# Patient Record
Sex: Male | Born: 1965 | Race: White | Hispanic: No | Smoking: Never smoker
Health system: Southern US, Community
[De-identification: ages and names within clinical notes are randomized; demographics above are authoritative.]

## PROBLEM LIST (undated history)

## (undated) DIAGNOSIS — E119 Type 2 diabetes mellitus without complications: Secondary | ICD-10-CM

## (undated) DIAGNOSIS — H9190 Unspecified hearing loss, unspecified ear: Secondary | ICD-10-CM

## (undated) DIAGNOSIS — E785 Hyperlipidemia, unspecified: Secondary | ICD-10-CM

## (undated) HISTORY — PX: BUNIONECTOMY: SHX129

## (undated) HISTORY — DX: Hyperlipidemia, unspecified: E78.5

---

## 2016-04-04 DIAGNOSIS — E119 Type 2 diabetes mellitus without complications: Secondary | ICD-10-CM | POA: Diagnosis not present

## 2016-04-07 DIAGNOSIS — E119 Type 2 diabetes mellitus without complications: Secondary | ICD-10-CM | POA: Diagnosis not present

## 2016-04-25 DIAGNOSIS — E119 Type 2 diabetes mellitus without complications: Secondary | ICD-10-CM | POA: Diagnosis not present

## 2016-08-11 DIAGNOSIS — E119 Type 2 diabetes mellitus without complications: Secondary | ICD-10-CM | POA: Diagnosis not present

## 2016-08-15 DIAGNOSIS — E119 Type 2 diabetes mellitus without complications: Secondary | ICD-10-CM | POA: Diagnosis not present

## 2016-12-12 DIAGNOSIS — Z0000 Encounter for general adult medical examination without abnormal findings: Secondary | ICD-10-CM | POA: Diagnosis not present

## 2016-12-12 DIAGNOSIS — E119 Type 2 diabetes mellitus without complications: Secondary | ICD-10-CM | POA: Diagnosis not present

## 2016-12-12 DIAGNOSIS — Z125 Encounter for screening for malignant neoplasm of prostate: Secondary | ICD-10-CM | POA: Diagnosis not present

## 2016-12-14 DIAGNOSIS — Z0000 Encounter for general adult medical examination without abnormal findings: Secondary | ICD-10-CM | POA: Diagnosis not present

## 2016-12-14 DIAGNOSIS — E119 Type 2 diabetes mellitus without complications: Secondary | ICD-10-CM | POA: Diagnosis not present

## 2016-12-14 DIAGNOSIS — E782 Mixed hyperlipidemia: Secondary | ICD-10-CM | POA: Diagnosis not present

## 2016-12-14 DIAGNOSIS — Z23 Encounter for immunization: Secondary | ICD-10-CM | POA: Diagnosis not present

## 2016-12-15 ENCOUNTER — Encounter (HOSPITAL_COMMUNITY): Payer: Self-pay

## 2016-12-15 ENCOUNTER — Other Ambulatory Visit: Payer: Self-pay

## 2016-12-15 DIAGNOSIS — H9192 Unspecified hearing loss, left ear: Secondary | ICD-10-CM | POA: Diagnosis not present

## 2016-12-15 DIAGNOSIS — Z5321 Procedure and treatment not carried out due to patient leaving prior to being seen by health care provider: Secondary | ICD-10-CM | POA: Insufficient documentation

## 2016-12-15 NOTE — ED Triage Notes (Signed)
Pt states that about 20 minutes ago he lost the hearing in his L ear. Pt has a hx of hearing loss about 5 years ago in the same ear.

## 2016-12-16 ENCOUNTER — Emergency Department (HOSPITAL_COMMUNITY)
Admission: EM | Admit: 2016-12-16 | Discharge: 2016-12-16 | Disposition: A | Discharge status: Home / Self Care | Payer: 59 | Attending: Emergency Medicine | Admitting: Emergency Medicine

## 2016-12-16 HISTORY — DX: Type 2 diabetes mellitus without complications: E11.9

## 2016-12-16 HISTORY — DX: Unspecified hearing loss, unspecified ear: H91.90

## 2016-12-16 NOTE — ED Notes (Signed)
Pt has decided to leave because of the wait.

## 2016-12-28 DIAGNOSIS — J069 Acute upper respiratory infection, unspecified: Secondary | ICD-10-CM | POA: Diagnosis not present

## 2017-02-02 DIAGNOSIS — M549 Dorsalgia, unspecified: Secondary | ICD-10-CM | POA: Diagnosis not present

## 2017-02-02 DIAGNOSIS — Z6827 Body mass index (BMI) 27.0-27.9, adult: Secondary | ICD-10-CM | POA: Diagnosis not present

## 2017-02-07 DIAGNOSIS — B181 Chronic viral hepatitis B without delta-agent: Secondary | ICD-10-CM | POA: Diagnosis not present

## 2017-03-26 DIAGNOSIS — E119 Type 2 diabetes mellitus without complications: Secondary | ICD-10-CM | POA: Diagnosis not present

## 2017-03-26 DIAGNOSIS — Z6826 Body mass index (BMI) 26.0-26.9, adult: Secondary | ICD-10-CM | POA: Diagnosis not present

## 2017-05-03 DIAGNOSIS — E119 Type 2 diabetes mellitus without complications: Secondary | ICD-10-CM | POA: Diagnosis not present

## 2017-05-10 DIAGNOSIS — Z6827 Body mass index (BMI) 27.0-27.9, adult: Secondary | ICD-10-CM | POA: Diagnosis not present

## 2017-05-10 DIAGNOSIS — E119 Type 2 diabetes mellitus without complications: Secondary | ICD-10-CM | POA: Diagnosis not present

## 2017-05-30 DIAGNOSIS — Z6827 Body mass index (BMI) 27.0-27.9, adult: Secondary | ICD-10-CM | POA: Diagnosis not present

## 2017-05-30 DIAGNOSIS — E119 Type 2 diabetes mellitus without complications: Secondary | ICD-10-CM | POA: Diagnosis not present

## 2017-08-09 DIAGNOSIS — R1110 Vomiting, unspecified: Secondary | ICD-10-CM | POA: Diagnosis not present

## 2017-08-09 DIAGNOSIS — R109 Unspecified abdominal pain: Secondary | ICD-10-CM | POA: Diagnosis not present

## 2017-08-09 DIAGNOSIS — Z6826 Body mass index (BMI) 26.0-26.9, adult: Secondary | ICD-10-CM | POA: Diagnosis not present

## 2017-08-09 DIAGNOSIS — R110 Nausea: Secondary | ICD-10-CM | POA: Diagnosis not present

## 2017-08-09 DIAGNOSIS — E119 Type 2 diabetes mellitus without complications: Secondary | ICD-10-CM | POA: Diagnosis not present

## 2017-08-10 ENCOUNTER — Inpatient Hospital Stay (HOSPITAL_COMMUNITY)
Admission: EM | Admit: 2017-08-10 | Discharge: 2017-08-12 | DRG: 638 | Disposition: A | Discharge status: Home / Self Care | Payer: BLUE CROSS/BLUE SHIELD | Attending: Internal Medicine | Admitting: Internal Medicine

## 2017-08-10 ENCOUNTER — Inpatient Hospital Stay (HOSPITAL_COMMUNITY): Payer: BLUE CROSS/BLUE SHIELD

## 2017-08-10 ENCOUNTER — Other Ambulatory Visit: Payer: Self-pay

## 2017-08-10 ENCOUNTER — Encounter (HOSPITAL_COMMUNITY): Payer: Self-pay

## 2017-08-10 DIAGNOSIS — R0902 Hypoxemia: Secondary | ICD-10-CM

## 2017-08-10 DIAGNOSIS — Z9119 Patient's noncompliance with other medical treatment and regimen: Secondary | ICD-10-CM | POA: Diagnosis not present

## 2017-08-10 DIAGNOSIS — R0682 Tachypnea, not elsewhere classified: Secondary | ICD-10-CM | POA: Diagnosis present

## 2017-08-10 DIAGNOSIS — R748 Abnormal levels of other serum enzymes: Secondary | ICD-10-CM | POA: Diagnosis not present

## 2017-08-10 DIAGNOSIS — H9190 Unspecified hearing loss, unspecified ear: Secondary | ICD-10-CM | POA: Diagnosis present

## 2017-08-10 DIAGNOSIS — Z794 Long term (current) use of insulin: Secondary | ICD-10-CM

## 2017-08-10 DIAGNOSIS — E871 Hypo-osmolality and hyponatremia: Secondary | ICD-10-CM | POA: Diagnosis not present

## 2017-08-10 DIAGNOSIS — E860 Dehydration: Secondary | ICD-10-CM | POA: Diagnosis not present

## 2017-08-10 DIAGNOSIS — E872 Acidosis: Secondary | ICD-10-CM | POA: Diagnosis not present

## 2017-08-10 DIAGNOSIS — E1310 Other specified diabetes mellitus with ketoacidosis without coma: Secondary | ICD-10-CM

## 2017-08-10 DIAGNOSIS — N179 Acute kidney failure, unspecified: Secondary | ICD-10-CM

## 2017-08-10 DIAGNOSIS — D696 Thrombocytopenia, unspecified: Secondary | ICD-10-CM | POA: Diagnosis not present

## 2017-08-10 DIAGNOSIS — D72829 Elevated white blood cell count, unspecified: Secondary | ICD-10-CM | POA: Diagnosis not present

## 2017-08-10 DIAGNOSIS — E119 Type 2 diabetes mellitus without complications: Secondary | ICD-10-CM | POA: Diagnosis not present

## 2017-08-10 DIAGNOSIS — D649 Anemia, unspecified: Secondary | ICD-10-CM | POA: Diagnosis present

## 2017-08-10 DIAGNOSIS — R110 Nausea: Secondary | ICD-10-CM | POA: Diagnosis not present

## 2017-08-10 DIAGNOSIS — E1110 Type 2 diabetes mellitus with ketoacidosis without coma: Secondary | ICD-10-CM | POA: Diagnosis not present

## 2017-08-10 DIAGNOSIS — Z79899 Other long term (current) drug therapy: Secondary | ICD-10-CM

## 2017-08-10 DIAGNOSIS — R1110 Vomiting, unspecified: Secondary | ICD-10-CM | POA: Diagnosis not present

## 2017-08-10 DIAGNOSIS — E875 Hyperkalemia: Secondary | ICD-10-CM | POA: Diagnosis present

## 2017-08-10 DIAGNOSIS — R000 Tachycardia, unspecified: Secondary | ICD-10-CM | POA: Diagnosis present

## 2017-08-10 DIAGNOSIS — R531 Weakness: Secondary | ICD-10-CM | POA: Diagnosis not present

## 2017-08-10 DIAGNOSIS — E1165 Type 2 diabetes mellitus with hyperglycemia: Secondary | ICD-10-CM | POA: Diagnosis not present

## 2017-08-10 DIAGNOSIS — E876 Hypokalemia: Secondary | ICD-10-CM | POA: Diagnosis present

## 2017-08-10 DIAGNOSIS — E1010 Type 1 diabetes mellitus with ketoacidosis without coma: Secondary | ICD-10-CM | POA: Diagnosis present

## 2017-08-10 DIAGNOSIS — R739 Hyperglycemia, unspecified: Secondary | ICD-10-CM

## 2017-08-10 LAB — URINALYSIS, ROUTINE W REFLEX MICROSCOPIC
Bacteria, UA: NONE SEEN
Bilirubin Urine: NEGATIVE
KETONES UR: 20 mg/dL — AB
Leukocytes, UA: NEGATIVE
Nitrite: NEGATIVE
Protein, ur: 30 mg/dL — AB
SPECIFIC GRAVITY, URINE: 1.018 (ref 1.005–1.030)
pH: 5 (ref 5.0–8.0)

## 2017-08-10 LAB — BASIC METABOLIC PANEL
Anion gap: 25 — ABNORMAL HIGH (ref 5–15)
Anion gap: 18 — ABNORMAL HIGH (ref 5–15)
Anion gap: 13 (ref 5–15)
BUN: 73 mg/dL — AB (ref 6–20)
BUN: 69 mg/dL — AB (ref 6–20)
BUN: 62 mg/dL — AB (ref 6–20)
CHLORIDE: 89 mmol/L — AB (ref 98–111)
CHLORIDE: 98 mmol/L (ref 98–111)
CHLORIDE: 106 mmol/L (ref 98–111)
CO2: 8 mmol/L — AB (ref 22–32)
CO2: 14 mmol/L — ABNORMAL LOW (ref 22–32)
CO2: 18 mmol/L — AB (ref 22–32)
CREATININE: 4.32 mg/dL — AB (ref 0.61–1.24)
CREATININE: 3.75 mg/dL — AB (ref 0.61–1.24)
CREATININE: 2.76 mg/dL — AB (ref 0.61–1.24)
Calcium: 7.2 mg/dL — ABNORMAL LOW (ref 8.9–10.3)
Calcium: 7.5 mg/dL — ABNORMAL LOW (ref 8.9–10.3)
Calcium: 7.9 mg/dL — ABNORMAL LOW (ref 8.9–10.3)
GFR calc Af Amer: 17 mL/min — ABNORMAL LOW (ref >60)
GFR calc Af Amer: 20 mL/min — ABNORMAL LOW (ref >60)
GFR calc Af Amer: 29 mL/min — ABNORMAL LOW (ref >60)
GFR calc non Af Amer: 15 mL/min — ABNORMAL LOW (ref >60)
GFR calc non Af Amer: 17 mL/min — ABNORMAL LOW (ref >60)
GFR calc non Af Amer: 25 mL/min — ABNORMAL LOW (ref >60)
GLUCOSE: 1171 mg/dL — AB (ref 70–99)
GLUCOSE: 733 mg/dL — AB (ref 70–99)
Glucose, Bld: 374 mg/dL — ABNORMAL HIGH (ref 70–99)
POTASSIUM: 5.6 mmol/L — AB (ref 3.5–5.1)
POTASSIUM: 4.4 mmol/L (ref 3.5–5.1)
POTASSIUM: 3.9 mmol/L (ref 3.5–5.1)
SODIUM: 122 mmol/L — AB (ref 135–145)
SODIUM: 130 mmol/L — AB (ref 135–145)
SODIUM: 137 mmol/L (ref 135–145)

## 2017-08-10 LAB — I-STAT CHEM 8, ED
BUN: 75 mg/dL — AB (ref 6–20)
BUN: 74 mg/dL — ABNORMAL HIGH (ref 6–20)
CHLORIDE: 84 mmol/L — AB (ref 98–111)
Calcium, Ion: 1.01 mmol/L — ABNORMAL LOW (ref 1.15–1.40)
Calcium, Ion: 0.94 mmol/L — ABNORMAL LOW (ref 1.15–1.40)
Chloride: 91 mmol/L — ABNORMAL LOW (ref 98–111)
Creatinine, Ser: 3.3 mg/dL — ABNORMAL HIGH (ref 0.61–1.24)
Creatinine, Ser: 3.1 mg/dL — ABNORMAL HIGH (ref 0.61–1.24)
Glucose, Bld: >700 mg/dL (ref 70–99)
Glucose, Bld: >700 mg/dL (ref 70–99)
HEMATOCRIT: 48 % (ref 39.0–52.0)
HEMATOCRIT: 44 % (ref 39.0–52.0)
HEMOGLOBIN: 15 g/dL (ref 13.0–17.0)
Hemoglobin: 16.3 g/dL (ref 13.0–17.0)
POTASSIUM: 7 mmol/L — AB (ref 3.5–5.1)
Potassium: 7.3 mmol/L (ref 3.5–5.1)
SODIUM: 111 mmol/L — AB (ref 135–145)
Sodium: 115 mmol/L — CL (ref 135–145)
TCO2: 6 mmol/L — ABNORMAL LOW (ref 22–32)
TCO2: 6 mmol/L — AB (ref 22–32)

## 2017-08-10 LAB — BLOOD GAS, ARTERIAL
Acid-base deficit: 26.9 mmol/L — ABNORMAL HIGH (ref 0.0–2.0)
BICARBONATE: 2.8 mmol/L — AB (ref 20.0–28.0)
Drawn by: 44137
FIO2: 21
O2 Saturation: 97.6 %
PATIENT TEMPERATURE: 98.6
PH ART: 6.97 — AB (ref 7.350–7.450)
PO2 ART: 146 mmHg — AB (ref 83.0–108.0)
pCO2 arterial: 12.8 mmHg — CL (ref 32.0–48.0)

## 2017-08-10 LAB — COMPREHENSIVE METABOLIC PANEL
ALBUMIN: 3.9 g/dL (ref 3.5–5.0)
ALT: 30 U/L (ref 0–44)
AST: 31 U/L (ref 15–41)
Alkaline Phosphatase: 65 U/L (ref 38–126)
BILIRUBIN TOTAL: 2.5 mg/dL — AB (ref 0.3–1.2)
BUN: 71 mg/dL — AB (ref 6–20)
CHLORIDE: 76 mmol/L — AB (ref 98–111)
CO2: <7 mmol/L — ABNORMAL LOW (ref 22–32)
Calcium: 7.6 mg/dL — ABNORMAL LOW (ref 8.9–10.3)
Creatinine, Ser: 4.23 mg/dL — ABNORMAL HIGH (ref 0.61–1.24)
GFR calc Af Amer: 17 mL/min — ABNORMAL LOW (ref >60)
GFR calc non Af Amer: 15 mL/min — ABNORMAL LOW (ref >60)
GLUCOSE: 1598 mg/dL — AB (ref 70–99)
Sodium: 114 mmol/L — CL (ref 135–145)
TOTAL PROTEIN: 6.4 g/dL — AB (ref 6.5–8.1)

## 2017-08-10 LAB — CBC
HCT: 50 % (ref 39.0–52.0)
HEMATOCRIT: 45.5 % (ref 39.0–52.0)
Hemoglobin: 13.5 g/dL (ref 13.0–17.0)
Hemoglobin: 13.6 g/dL (ref 13.0–17.0)
MCH: 31.8 pg (ref 26.0–34.0)
MCH: 31.2 pg (ref 26.0–34.0)
MCHC: 27 g/dL — ABNORMAL LOW (ref 30.0–36.0)
MCHC: 29.9 g/dL — AB (ref 30.0–36.0)
MCV: 117.9 fL — ABNORMAL HIGH (ref 78.0–100.0)
MCV: 104.4 fL — AB (ref 78.0–100.0)
Platelets: 130 10*3/uL — ABNORMAL LOW (ref 150–400)
Platelets: 92 10*3/uL — ABNORMAL LOW (ref 150–400)
RBC: 4.24 MIL/uL (ref 4.22–5.81)
RBC: 4.36 MIL/uL (ref 4.22–5.81)
RDW: 12.2 % (ref 11.5–15.5)
RDW: 12 % (ref 11.5–15.5)
WBC: 17.5 10*3/uL — ABNORMAL HIGH (ref 4.0–10.5)
WBC: 13.3 10*3/uL — ABNORMAL HIGH (ref 4.0–10.5)

## 2017-08-10 LAB — GLUCOSE, CAPILLARY
GLUCOSE-CAPILLARY: 492 mg/dL — AB (ref 70–99)
GLUCOSE-CAPILLARY: 366 mg/dL — AB (ref 70–99)
Glucose-Capillary: >600 mg/dL (ref 70–99)
Glucose-Capillary: >600 mg/dL (ref 70–99)
Glucose-Capillary: >600 mg/dL (ref 70–99)
Glucose-Capillary: >600 mg/dL (ref 70–99)
Glucose-Capillary: 380 mg/dL — ABNORMAL HIGH (ref 70–99)

## 2017-08-10 LAB — I-STAT TROPONIN, ED: TROPONIN I, POC: 0.07 ng/mL (ref 0.00–0.08)

## 2017-08-10 LAB — CBG MONITORING, ED
Glucose-Capillary: >600 mg/dL (ref 70–99)
Glucose-Capillary: >600 mg/dL (ref 70–99)
Glucose-Capillary: >600 mg/dL (ref 70–99)

## 2017-08-10 LAB — LIPASE, BLOOD: LIPASE: 274 U/L — AB (ref 11–51)

## 2017-08-10 LAB — MRSA PCR SCREENING: MRSA BY PCR: NEGATIVE

## 2017-08-10 MED ORDER — DEXTROSE-NACL 5-0.45 % IV SOLN
INTRAVENOUS | Status: DC
  Administered 2017-08-11: 125 mL/h via INTRAVENOUS

## 2017-08-10 MED ORDER — HEPARIN SODIUM (PORCINE) 5000 UNIT/ML IJ SOLN
5000.0000 [IU] | Freq: Three times a day (TID) | INTRAMUSCULAR | Status: DC
  Administered 2017-08-10 – 2017-08-11 (×3): 5000 [IU] via SUBCUTANEOUS
  Filled 2017-08-10 (×3): qty 1

## 2017-08-10 MED ORDER — SODIUM CHLORIDE 0.45 % IV SOLN
INTRAVENOUS | Status: DC
  Administered 2017-08-10: 16:00:00 via INTRAVENOUS
  Administered 2017-08-10: 200 mL/h via INTRAVENOUS

## 2017-08-10 MED ORDER — SODIUM CHLORIDE 0.9 % IV SOLN
INTRAVENOUS | Status: DC
  Administered 2017-08-10: 27.1 [IU]/h via INTRAVENOUS
  Administered 2017-08-10: 5.4 [IU]/h via INTRAVENOUS
  Filled 2017-08-10 (×3): qty 1

## 2017-08-10 MED ORDER — SODIUM CHLORIDE 0.9 % IV BOLUS
1000.0000 mL | Freq: Once | INTRAVENOUS | Status: AC
  Administered 2017-08-10: 1000 mL via INTRAVENOUS

## 2017-08-10 MED ORDER — ONDANSETRON HCL 4 MG/2ML IJ SOLN
4.0000 mg | Freq: Once | INTRAMUSCULAR | Status: AC
  Administered 2017-08-10: 4 mg via INTRAVENOUS
  Filled 2017-08-10: qty 2

## 2017-08-10 MED ORDER — ORAL CARE MOUTH RINSE
15.0000 mL | Freq: Two times a day (BID) | OROMUCOSAL | Status: DC
  Administered 2017-08-11: 15 mL via OROMUCOSAL

## 2017-08-10 MED ORDER — SODIUM CHLORIDE 0.9 % IV SOLN: INTRAVENOUS | Status: DC

## 2017-08-10 MED ORDER — ONDANSETRON HCL 4 MG/2ML IJ SOLN
4.0000 mg | Freq: Four times a day (QID) | INTRAMUSCULAR | Status: DC | PRN
  Administered 2017-08-10 – 2017-08-11 (×2): 4 mg via INTRAVENOUS
  Filled 2017-08-10 (×2): qty 2

## 2017-08-10 MED ORDER — ACETAMINOPHEN 325 MG PO TABS
650.0000 mg | ORAL_TABLET | ORAL | Status: DC | PRN
  Administered 2017-08-11: 650 mg via ORAL
  Filled 2017-08-10: qty 2

## 2017-08-10 MED ORDER — CHLORHEXIDINE GLUCONATE 0.12 % MT SOLN
15.0000 mL | Freq: Two times a day (BID) | OROMUCOSAL | Status: DC
  Administered 2017-08-10 – 2017-08-11 (×3): 15 mL via OROMUCOSAL
  Filled 2017-08-10: qty 15

## 2017-08-10 MED ORDER — SODIUM CHLORIDE 0.9 % IV SOLN
Freq: Once | INTRAVENOUS | Status: AC
  Administered 2017-08-10: 14:00:00 via INTRAVENOUS

## 2017-08-10 MED ORDER — SODIUM CHLORIDE 0.9 % IV SOLN: 250.0000 mL | INTRAVENOUS | Status: DC | PRN

## 2017-08-10 NOTE — Progress Notes (Addendum)
Inpatient Diabetes Program Recommendations  AACE/ADA: New Consensus Statement on Inpatient Glycemic Control (2015)  Target Ranges:  Prepandial:   less than 140 mg/dL      Peak postprandial:   less than 180 mg/dL (1-2 hours)      Critically ill patients:  140 - 180 mg/dL   Review of Glycemic Control  Diabetes history: DM 2 Outpatient Diabetes medications: Invokana Daily, Tresiba 24 units Daily Current orders for Inpatient glycemic control: IV insulin gtt per protocol  Inpatient Diabetes Program Recommendations:    Noted patient is on Invokana at home. This medication is an SGLT-2 inhibitor known to cause DM 2 suceptible to go into DKA during acute GI illness. Please d/c at time of discharge and have patient follow up with prescriber.  Please note in these cases it takes the patient longer to clear the acidosis than in regular DKA cases.  Consider A1c level to determine glucose control over the past 2-3 months.  1600 pm Spoke with patient at bedside. Patient has seen his PCP over the past 2 days. PCP manages his DM outpatient. Patient states his last A1c was 7.1% at last check. Patient has been on [place] for 3 years.  Spoke with patient about IV insulin treatment for now, Invokana and ketoacidosis. Will watch trends while here.  Thanks,  Tama Headings RN, MSN, BC-ADM, [facility] Inpatient Diabetes Coordinator Team Pager [phone] (8a-5p)

## 2017-08-10 NOTE — ED Provider Notes (Signed)
[place] EMERGENCY DEPARTMENT Provider Note   CSN: [id number] Arrival date & time: 08/10/17  1017     History   Chief Complaint Chief Complaint  Patient presents with  . Hyperglycemia    HPI Benjamin Keller is a 52 y.o. male.  52 year old male with prior history of diabetes presents with nausea, vomiting, and weakness.  Patient reports that he has not been able to take his insulin since at least 4 days prior.  He reports worsening nausea and vomiting since.  He denies fever.  He denies chest pain or shortness of breath.  He appears acutely ill.  The history is provided by the patient.  Hyperglycemia  Severity:  Severe Onset quality:  Unable to specify Duration:  3 days Timing:  Constant Progression:  Worsening Chronicity:  New Diabetes status:  Controlled with insulin Time since last antidiabetic medication:  4 days Context: noncompliance   Relieved by:  Nothing Ineffective treatments:  None tried   Past Medical History:  Diagnosis Date  . Diabetes mellitus without complication ([place])   . Hearing loss     There are no active problems to display for this patient.   History reviewed. No pertinent surgical history.      Home Medications    Prior to Admission medications   Medication Sig Start Date End Date Taking? Authorizing Provider  Canagliflozin (INVOKANA PO) Take 1 tablet daily by mouth.    [provider]  insulin degludec (TRESIBA FLEXTOUCH) 100 UNIT/ML SOPN FlexTouch Pen Inject 24 Units daily into the skin.    [provider]  Oxymetazoline HCl (NASAL SPRAY NA) Place 1 spray every 6 (six) hours as needed into the nose.    [provider]    Family History No family history on file.  Social History Social History   Tobacco Use  . Smoking status: Never Smoker  . Smokeless tobacco: Never Used  Substance Use Topics  . Alcohol use: No    Frequency: Never  . Drug use: No     Allergies     Patient has no known allergies.   Review of Systems Review of Systems  All other systems reviewed and are negative.    Physical Exam Updated Vital Signs BP (!) 135/55   Pulse 98   Temp (!) 95.8 F (35.4 C) (Rectal)   Ht 6\' 1"  (1.854 m)   Wt 86.2 kg (190 lb)   SpO2 100%   BMI 25.07 kg/m   Physical Exam  Constitutional: He is oriented to person, place, and time. He appears distressed.  Appears distressed and uncomfortable  Tachypneic  HENT:  Head: Normocephalic and atraumatic.  Mouth/Throat: Oropharynx is clear and moist.  Eyes: Pupils are equal, round, and reactive to light. Conjunctivae and EOM are normal.  Neck: Normal range of motion. Neck supple.  Cardiovascular: Normal rate, regular rhythm and normal heart sounds.  Pulmonary/Chest: Breath sounds normal. No respiratory distress.  Tachypneic  Abdominal: Soft. He exhibits no distension. There is no tenderness.  Musculoskeletal: Normal range of motion. He exhibits no edema or deformity.  Neurological: He is alert and oriented to person, place, and time.  Skin: Skin is warm and dry.  Psychiatric: He has a normal mood and affect.  Nursing note and vitals reviewed.    ED Treatments / Results  Labs (all labs ordered are listed, but only abnormal results are displayed) Labs Reviewed  CBC - Abnormal; Notable for the following components:      Result Value  WBC 17.5 (*)    MCV 117.9 (*)    MCHC 27.0 (*)    Platelets 130 (*)    All other components within normal limits  LIPASE, BLOOD - Abnormal; Notable for the following components:   Lipase 274 (*)    All other components within normal limits  COMPREHENSIVE METABOLIC PANEL - Abnormal; Notable for the following components:   Sodium 114 (*)    Potassium >7.5 (*)    Chloride 76 (*)    CO2 <7 (*)    Glucose, Bld 1,598 (*)    BUN 71 (*)    Creatinine, Ser 4.23 (*)    Calcium 7.6 (*)    Total Protein 6.4 (*)    Total Bilirubin 2.5 (*)    GFR calc non Af Amer  15 (*)    GFR calc Af Amer 17 (*)    All other components within normal limits  BLOOD GAS, ARTERIAL - Abnormal; Notable for the following components:   pH, Arterial 6.970 (*)    pCO2 arterial 12.8 (*)    pO2, Arterial 146 (*)    Bicarbonate 2.8 (*)    Acid-base deficit 26.9 (*)    All other components within normal limits  CBG MONITORING, ED - Abnormal; Notable for the following components:   Glucose-Capillary >600 (*)    All other components within normal limits  I-STAT CHEM 8, ED - Abnormal; Notable for the following components:   Sodium 111 (*)    Potassium 7.3 (*)    Chloride 84 (*)    BUN 75 (*)    Creatinine, Ser 3.30 (*)    Glucose, Bld >700 (*)    Calcium, Ion 1.01 (*)    TCO2 6 (*)    All other components within normal limits  I-STAT CHEM 8, ED - Abnormal; Notable for the following components:   Sodium 115 (*)    Potassium 7.0 (*)    Chloride 91 (*)    BUN 74 (*)    Creatinine, Ser 3.10 (*)    Glucose, Bld >700 (*)    Calcium, Ion 0.94 (*)    TCO2 6 (*)    All other components within normal limits  CBG MONITORING, ED - Abnormal; Notable for the following components:   Glucose-Capillary >600 (*)    All other components within normal limits  URINALYSIS, ROUTINE W REFLEX MICROSCOPIC  I-STAT TROPONIN, ED    EKG EKG Interpretation  Date/Time:  Friday August 10 2017 12:45:30 EDT Ventricular Rate:  111 PR Interval:    QRS Duration: 94 QT Interval:  353 QTC Calculation: 480 R Axis:   76 Text Interpretation:  Sinus or ectopic atrial tachycardia Borderline prolonged QT interval Confirmed by Dene Gentry [phone]) on 08/10/2017 12:56:44 PM   Radiology No results found.  Procedures Procedures (including critical care time) CRITICAL CARE Performed by: Valarie Merino   Total critical care time: 60 minutes  Critical care time was exclusive of separately billable procedures and treating other patients.  Critical care was necessary to treat or prevent imminent  or life-threatening deterioration.  Critical care was time spent personally by me on the following activities: development of treatment plan with patient and/or surrogate as well as nursing, discussions with consultants, evaluation of patient's response to treatment, examination of patient, obtaining history from patient or surrogate, ordering and performing treatments and interventions, ordering and review of laboratory studies, ordering and review of radiographic studies, pulse oximetry and re-evaluation of patient's condition.   Medications Ordered  in ED Medications  insulin regular (NOVOLIN R,HUMULIN R) 100 Units in sodium chloride 0.9 % 100 mL (1 Units/mL) infusion (10.8 Units/hr Intravenous Rate/Dose Change 08/10/17 1255)  0.9 %  sodium chloride infusion (has no administration in time range)  ondansetron (ZOFRAN) injection 4 mg (4 mg Intravenous Given 08/10/17 1056)  sodium chloride 0.9 % bolus 1,000 mL (0 mLs Intravenous Stopped 08/10/17 1200)  sodium chloride 0.9 % bolus 1,000 mL (0 mLs Intravenous Stopped 08/10/17 1304)  sodium chloride 0.9 % bolus 1,000 mL (0 mLs Intravenous Stopped 08/10/17 1302)     Initial Impression / Assessment and Plan / ED Course  I have reviewed the triage vital signs and the nursing notes.  Pertinent labs & imaging results that were available during my care of the patient were reviewed by me and considered in my medical decision making (see chart for details).     MDM  Screen complete  Patient is presenting for evaluation of elevated blood glucose associated with nausea and vomiting.  Patient appears to be hyperglycemic and severely dehydrated.  He appears to be in DKA.  IV fluid resuscitation initiated immediately.  Insulin drip initiated.    Critical care service consulted for further management.  Final Clinical Impressions(s) / ED Diagnoses   Final diagnoses:  Hyperglycemia  Hyponatremia  Dehydration  AKI (acute kidney injury) ([place])  Diabetic  ketoacidosis without coma associated with type 2 diabetes mellitus [facility])    ED Discharge Orders    None       Valarie Merino, MD 08/10/17 1441

## 2017-08-10 NOTE — Progress Notes (Signed)
Inpatient Diabetes Program Recommendations  AACE/ADA: New Consensus Statement on Inpatient Glycemic Control (2015)  Target Ranges:  Prepandial:   less than 140 mg/dL      Peak postprandial:   less than 180 mg/dL (1-2 hours)      Critically ill patients:  140 - 180 mg/dL   In speaking with pateint and partner at bedside. Patient had trouble with insurance coverage with Invokana and was switched to [place] (Empagliflozin/Metformin) 4 months ago. Empagliflozin is also an SGLT-2 inhibitor and would place the patient in DKA.  By patient report he missed his insulin dose and Synjardy yesterday and was also told to drink regular Gatorade while sick. Patient did not think to check his glucose. Patient reports he is a type 2 and was tested when initially diagnosed.  Spoke to patient about sick day guidelines and taking his insulin while sick and checking glucose more often. Discussed Ketone strips over the counter as well if still taking an SGLT-2.  Thanks,  Tama Headings RN, MSN, BC-ADM, [facility] Inpatient Diabetes Coordinator Team Pager [phone] (8a-5p)

## 2017-08-10 NOTE — ED Notes (Signed)
Pt's CBG "high". Reported to [place], Therapist, sports.

## 2017-08-10 NOTE — ED Triage Notes (Signed)
Pt sent by PCP for DKA. Pt states that he is type 2 diabetic and has not been taking his medications X2 days. Pt states he has not been taking his medication r/t NV X2 days.

## 2017-08-10 NOTE — Progress Notes (Signed)
Critical ABG values given to Dr. Francia Greaves.

## 2017-08-10 NOTE — H&P (Addendum)
PULMONARY / CRITICAL CARE MEDICINE   Name: Benjamin Keller MRN: [id number] DOB: [birth year]    ADMISSION DATE:  08/10/2017 CONSULTATION DATE:  08/10/2017  REFERRING MD:  Dr. Francia Greaves  CHIEF COMPLAINT:  Hyperglycemia  HISTORY OF PRESENT ILLNESS:   Benjamin Keller is a 52 y.o. Male with a PMH of DM II and hearing loss, who presents to [facility] on 08/10/17 with c/o Nausea, vomiting, and weakness.  Pt reports that he has been experiencing nausea and vomting for approximately 2 days and has been unable to take his insulin during that time period.  He denies any dyspnea, cough, fevers, chills, or chest pain. Workup in the ED was significant for glucose 1,598, +ketones on UA, metabolic acidosis (pH 9.51, CO 12.8, HCO3 2.8),  AKI, Hyponatremia, and Hyperkalemia.  Aggressive IVF resuscitation and IV insulin were initiated in ED. PCCM is consulted to admit pt, and for further management of DKA.  PAST MEDICAL HISTORY :  He  has a past medical history of Diabetes mellitus without complication ([place]) and Hearing loss.  PAST SURGICAL HISTORY: He  has no past surgical history on file.  No Known Allergies  No current facility-administered medications on file prior to encounter.    Current Outpatient Medications on File Prior to Encounter  Medication Sig  . Canagliflozin (INVOKANA PO) Take 1 tablet daily by mouth.  . insulin degludec (TRESIBA FLEXTOUCH) 100 UNIT/ML SOPN FlexTouch Pen Inject 24 Units daily into the skin.  Marland Kitchen Oxymetazoline HCl (NASAL SPRAY NA) Place 1 spray every 6 (six) hours as needed into the nose.    FAMILY HISTORY:  His family history is not on file.  SOCIAL HISTORY: He  reports that he has never smoked. He has never used smokeless tobacco. He reports that he does not drink alcohol or use drugs.  REVIEW OF SYSTEMS:   Positives in BOLD: Gen: Denies fever, chills, weight change, fatigue, night sweats HEENT: Denies blurred vision, double vision, hearing loss, tinnitus, sinus congestion,  rhinorrhea, sore throat, neck stiffness, dysphagia PULM: Denies shortness of breath, cough, sputum production, hemoptysis, wheezing CV: Denies chest pain, edema, orthopnea, paroxysmal nocturnal dyspnea, palpitations GI: Denies abdominal pain, nausea, vomiting, diarrhea, hematochezia, melena, constipation, change in bowel habits GU: Denies dysuria, hematuria, polyuria, oliguria, urethral discharge Endocrine: Denies hot or cold intolerance, polyuria, polyphagia or appetite change Derm: Denies rash, dry skin, scaling or peeling skin change Heme: Denies easy bruising, bleeding, bleeding gums Neuro: Denies headache, numbness, weakness, slurred speech, loss of memory or consciousness   SUBJECTIVE:  Afebrile, tachypnea, tachycardia Reports nausea Complains of dry mouth, wanting to drink liquids Denies pain  VITAL SIGNS: BP (!) 135/55   Pulse 98   Temp (!) 95.8 F (35.4 C) (Rectal)   Ht 6\' 1"  (1.854 m)   Wt 190 lb (86.2 kg)   SpO2 100%   BMI 25.07 kg/m   HEMODYNAMICS:    VENTILATOR SETTINGS:    INTAKE / OUTPUT: No intake/output data recorded.  PHYSICAL EXAMINATION: General:  Acutely ill appearing male, laying in bed, kussmaul's respirations, in mild distress Neuro:  Awake, A&O, follows commands, no focal deficits HEENT:  Atraumatic, normocephalic, neck supple, MM pink/dry Cardiovascular:  Tachycardia, Regular rhythm, s1s2, no M/R/G, 2+ pulses throughout Lungs:  Clear bilaterally, even, tachypnea (Kussmaul's respirations) Abdomen: Soft, non-tender, non-distended, BS + x4 Musculoskeletal:  No deformities, normal bulk and tone Skin:  No obvious rashes, lesions, or ulcerations  LABS:  BMET Recent Labs  Lab 08/10/17 1053 08/10/17 1101 08/10/17 1316  NA 114* 111*  115*  K >7.5* 7.3* 7.0*  CL 76* 84* 91*  CO2 <7*  --   --   BUN 71* 75* 74*  CREATININE 4.23* 3.30* 3.10*  GLUCOSE 1,598* >700* >700*    Electrolytes Recent Labs  Lab 08/10/17 1053  CALCIUM 7.6*     CBC Recent Labs  Lab 08/10/17 1030 08/10/17 1101 08/10/17 1316  WBC 17.5*  --   --   HGB 13.5 16.3 15.0  HCT 50.0 48.0 44.0  PLT 130*  --   --     Coag's No results for input(s): APTT, INR in the last 168 hours.  Sepsis Markers No results for input(s): LATICACIDVEN, PROCALCITON, O2SATVEN in the last 168 hours.  ABG Recent Labs  Lab 08/10/17 1145  PHART 6.970*  PCO2ART 12.8*  PO2ART 146*    Liver Enzymes Recent Labs  Lab 08/10/17 1053  AST 31  ALT 30  ALKPHOS 65  BILITOT 2.5*  ALBUMIN 3.9    Cardiac Enzymes No results for input(s): TROPONINI, PROBNP in the last 168 hours.  Glucose Recent Labs  Lab 08/10/17 1024 08/10/17 1252 08/10/17 1411  GLUCAP >600* >600* >600*    Imaging No results found.   STUDIES:  UA 7/12>> + Ketones  CULTURES:  ANTIBIOTICS:   SIGNIFICANT EVENTS: 08/10/17>> Admission to [facility] for DKA  LINES/TUBES:   DISCUSSION: 52 y.o. Male in DKA after being unable to take his insulin due to N/V for approximately 2 days.  ASSESSMENT / PLAN:  PULMONARY A: Tachypnea (Kussmaul's respirations), compensatory given severe metabolic acidosis P:   Supplemental O2 prn to maintain O2 sats>92%  CARDIOVASCULAR A:  Tachycardia, in setting of dehydration P:  Cardiac monitoring Aggressive IVF  RENAL A:   AKI in setting of dehydration Anion gap Metabolic Acidosis in setting of DKA Hyperkalemia Hyponatremia P:   Monitor I&O's / urine output Trend BMP Ensure adequate renal perfusion Avoid nephrotoxic agents as able Replace electrolytes as indicated Aggressive IVF, will change to 1/2 NS @ 200 ml/hr given hemodynamically stable No cardiac arrhythmias on EKG, K will decrease as acidosis is corrected, continue to monitor Corrected Na given Hyperglycemia is approx. 138 Follow BMP q4h    GASTROINTESTINAL A:   Nausea P:   NPO for now Prn Zofran  HEMATOLOGIC A:   No active issues P:  Monitor for s/sx of bleeding   Trend CBC Heparin SQ for VTE prophylaxis  INFECTIOUS A:   Leukocytosis, likely in setting of DKA -afebrile, UA negative for bacteria, denies SOB/cough P:   Monitor fever curve Trend WBC's  ENDOCRINE A:   DKA Hx: DM II P:   Aggressive IVF, will change to 1/2 NS @ 200 ml/hr given hemodynamically stable Once CBG <250, change IVF to D5 1/2NS @ 125 ml/hr Insulin gtt Hold home Invokana (may need to d/c at discharge and have follow up with prescriber as it is known to cause DM 2 to be susceptible to DKA during acute illness) Obtain A1c   NEUROLOGIC A:   No active issues P:   Provide supportive care Lights on during day Avoid sedating meds as able   FAMILY  - Updates: Updated pt and friend Benjamin Keller at bedside 08/10/17.  - Inter-disciplinary family meet or Palliative Care meeting due by:  08/17/2017    Darel Hong, AGACNP-BC [facility] 08/10/2017, 2:31 PM  Attending Note:  52 year old diabetic male who presents to the hospital in DKA after missing insulin due to N/V.  Benjamin Keller is very  dehydrated, in acute pre-renal renal failure, severely acidotic and hyperkalemic.  PCCM asked to admit.  On exam, Benjamin Keller is tachypneic with RR in the high 30's with clear lungs.  CXR is still pending at this time.  All labs reviewed, Benjamin Keller is adequately compensating for very severe metabolic acidosis.  Will admit to the ICU.  Start the DKA protocol.  Frequent BMET check to insure K is not rising but is dropping.  Will replace per protocol.  Monitor off abx since no fever and no overt signs of infection.  Check CXR to insure no infiltrate.  Will transfer out in AM and to [facility] service with PCCM off 7/13.  The Benjamin Keller is critically ill with multiple organ systems failure and requires high complexity decision making for assessment and support, frequent evaluation and titration of therapies, application of advanced monitoring technologies and extensive interpretation of  multiple databases.   Critical Care Time devoted to Benjamin Keller care services described in this note is  35  Minutes. This time reflects time of care of this signee Dr Jennet Maduro. This critical care time does not reflect procedure time, or teaching time or supervisory time of PA/NP/Med student/Med Resident etc but could involve care discussion time.  Rush Farmer, M.D. [facility] Pulmonary/Critical Care Medicine. Pager: [phone]. After hours pager: [phone].

## 2017-08-11 DIAGNOSIS — E1110 Type 2 diabetes mellitus with ketoacidosis without coma: Principal | ICD-10-CM

## 2017-08-11 LAB — CBC
HCT: 37 % — ABNORMAL LOW (ref 39.0–52.0)
Hemoglobin: 12.8 g/dL — ABNORMAL LOW (ref 13.0–17.0)
MCH: 31.3 pg (ref 26.0–34.0)
MCHC: 34.6 g/dL (ref 30.0–36.0)
MCV: 90.5 fL (ref 78.0–100.0)
Platelets: 105 10*3/uL — ABNORMAL LOW (ref 150–400)
RBC: 4.09 MIL/uL — AB (ref 4.22–5.81)
RDW: 12 % (ref 11.5–15.5)
WBC: 10.7 10*3/uL — AB (ref 4.0–10.5)

## 2017-08-11 LAB — GLUCOSE, CAPILLARY
GLUCOSE-CAPILLARY: 120 mg/dL — AB (ref 70–99)
GLUCOSE-CAPILLARY: 122 mg/dL — AB (ref 70–99)
GLUCOSE-CAPILLARY: 139 mg/dL — AB (ref 70–99)
GLUCOSE-CAPILLARY: 152 mg/dL — AB (ref 70–99)
GLUCOSE-CAPILLARY: 351 mg/dL — AB (ref 70–99)
GLUCOSE-CAPILLARY: 168 mg/dL — AB (ref 70–99)
Glucose-Capillary: 144 mg/dL — ABNORMAL HIGH (ref 70–99)
Glucose-Capillary: 181 mg/dL — ABNORMAL HIGH (ref 70–99)
Glucose-Capillary: 196 mg/dL — ABNORMAL HIGH (ref 70–99)
Glucose-Capillary: 226 mg/dL — ABNORMAL HIGH (ref 70–99)
Glucose-Capillary: 238 mg/dL — ABNORMAL HIGH (ref 70–99)
Glucose-Capillary: 257 mg/dL — ABNORMAL HIGH (ref 70–99)

## 2017-08-11 LAB — BASIC METABOLIC PANEL
ANION GAP: 12 (ref 5–15)
Anion gap: 13 (ref 5–15)
BUN: 56 mg/dL — ABNORMAL HIGH (ref 6–20)
BUN: 43 mg/dL — ABNORMAL HIGH (ref 6–20)
CALCIUM: 7.8 mg/dL — AB (ref 8.9–10.3)
CHLORIDE: 108 mmol/L (ref 98–111)
CO2: 19 mmol/L — AB (ref 22–32)
CO2: 19 mmol/L — ABNORMAL LOW (ref 22–32)
Calcium: 7.8 mg/dL — ABNORMAL LOW (ref 8.9–10.3)
Chloride: 106 mmol/L (ref 98–111)
Creatinine, Ser: 2.32 mg/dL — ABNORMAL HIGH (ref 0.61–1.24)
Creatinine, Ser: 1.69 mg/dL — ABNORMAL HIGH (ref 0.61–1.24)
GFR calc non Af Amer: 31 mL/min — ABNORMAL LOW (ref >60)
GFR, EST AFRICAN AMERICAN: 36 mL/min — AB (ref >60)
GFR, EST AFRICAN AMERICAN: 53 mL/min — AB (ref >60)
GFR, EST NON AFRICAN AMERICAN: 46 mL/min — AB (ref >60)
Glucose, Bld: 164 mg/dL — ABNORMAL HIGH (ref 70–99)
Glucose, Bld: 350 mg/dL — ABNORMAL HIGH (ref 70–99)
POTASSIUM: 4.1 mmol/L (ref 3.5–5.1)
Potassium: 3.6 mmol/L (ref 3.5–5.1)
SODIUM: 138 mmol/L (ref 135–145)
Sodium: 139 mmol/L (ref 135–145)

## 2017-08-11 LAB — MAGNESIUM: Magnesium: 2.8 mg/dL — ABNORMAL HIGH (ref 1.7–2.4)

## 2017-08-11 LAB — HEMOGLOBIN A1C
Hgb A1c MFr Bld: 8.3 % — ABNORMAL HIGH (ref 4.8–5.6)
MEAN PLASMA GLUCOSE: 192 mg/dL

## 2017-08-11 LAB — HIV ANTIBODY (ROUTINE TESTING W REFLEX): HIV Screen 4th Generation wRfx: NONREACTIVE

## 2017-08-11 MED ORDER — INSULIN ASPART 100 UNIT/ML ~~LOC~~ SOLN
0.0000 [IU] | Freq: Three times a day (TID) | SUBCUTANEOUS | Status: DC
  Administered 2017-08-11: 3 [IU] via SUBCUTANEOUS
  Administered 2017-08-12: 5 [IU] via SUBCUTANEOUS

## 2017-08-11 MED ORDER — INSULIN GLARGINE 100 UNIT/ML ~~LOC~~ SOLN
20.0000 [IU] | Freq: Every day | SUBCUTANEOUS | Status: DC
  Administered 2017-08-12: 20 [IU] via SUBCUTANEOUS
  Filled 2017-08-11: qty 0.2

## 2017-08-11 MED ORDER — INSULIN ASPART 100 UNIT/ML ~~LOC~~ SOLN: 0.0000 [IU] | Freq: Every day | SUBCUTANEOUS | Status: DC

## 2017-08-11 MED ORDER — INSULIN GLARGINE 100 UNIT/ML ~~LOC~~ SOLN
15.0000 [IU] | Freq: Once | SUBCUTANEOUS | Status: AC
  Administered 2017-08-11: 15 [IU] via SUBCUTANEOUS
  Filled 2017-08-11: qty 0.15

## 2017-08-11 MED ORDER — INSULIN GLARGINE 100 UNIT/ML ~~LOC~~ SOLN
5.0000 [IU] | Freq: Every day | SUBCUTANEOUS | Status: DC
  Administered 2017-08-11: 5 [IU] via SUBCUTANEOUS
  Filled 2017-08-11: qty 0.05

## 2017-08-11 MED ORDER — INSULIN ASPART 100 UNIT/ML ~~LOC~~ SOLN
0.0000 [IU] | SUBCUTANEOUS | Status: DC
  Administered 2017-08-11: 3 [IU] via SUBCUTANEOUS
  Administered 2017-08-11: 15 [IU] via SUBCUTANEOUS

## 2017-08-11 MED ORDER — PHENOL 1.4 % MT LIQD
1.0000 | OROMUCOSAL | Status: DC | PRN
Start: 2017-08-11 — End: 2017-08-12
  Administered 2017-08-11: 1 via OROMUCOSAL
  Filled 2017-08-11: qty 177

## 2017-08-11 MED ORDER — SODIUM CHLORIDE 0.9 % IV SOLN
INTRAVENOUS | Status: DC
  Administered 2017-08-11 – 2017-08-12 (×2): via INTRAVENOUS

## 2017-08-11 MED ORDER — POTASSIUM CHLORIDE CRYS ER 20 MEQ PO TBCR
20.0000 meq | EXTENDED_RELEASE_TABLET | Freq: Once | ORAL | Status: AC
  Administered 2017-08-11: 20 meq via ORAL
  Filled 2017-08-11: qty 1

## 2017-08-11 NOTE — Progress Notes (Signed)
[place] [facility] Progress Note Patient Name: Benjamin Keller DOB: 09-Jan-1966 MRN: [id number]   Date of Service  08/11/2017  HPI/Events of Note  Hypokalemia  eICU Interventions  Potassium replaced     Intervention Category Minor Interventions: Electrolytes abnormality - evaluation and management  Eun Vermeer 08/11/2017, 5:44 AM

## 2017-08-11 NOTE — Progress Notes (Signed)
Inpatient Diabetes Program Recommendations  AACE/ADA: New Consensus Statement on Inpatient Glycemic Control (2015)  Target Ranges:  Prepandial:   less than 140 mg/dL      Peak postprandial:   less than 180 mg/dL (1-2 hours)      Critically ill patients:  140 - 180 mg/dL   Lab Results  Component Value Date   GLUCAP 152 (H) 08/11/2017   HGBA1C 8.3 (H) 08/10/2017    Review of Glycemic Control  Diabetes history: type 2 Outpatient Diabetes medications: Tresiba 20 units daily, Synjardy (Jardiance/Metformin) daily Current orders for Inpatient glycemic control: Lantus 5 units daily, Novolog 0-15 units every 4 hours.  Inpatient Diabetes Program Recommendations:   Recommend increasing Lantus to 20 units daily  since patient takes 20 units of Tresiba at home. Continue Novolog 0-15 units correction scale every 4 hours now and then TID & HS when eating. Since patient had Lantus 5 units this am when drip was stopped, the rest of the Lantus dose can be given this am.   Iva Boop should be discontinued until further follow up with PCP.   Harvel Ricks RN BSN CDE Diabetes Coordinator Pager: [phone]  8am-5pm

## 2017-08-11 NOTE — Care Management Note (Signed)
Case Management Note  Patient Details  Name: Benjamin Keller MRN: [id number] Date of Birth: [birth year]  Subjective/Objective:  From home presents with DKA, ARF, leukocytosis, anemia, mild thrombocytopenia. Now off insulin drip.                    Action/Plan: NCM will follow for dc needs.   Expected Discharge Date:                  Expected Discharge Plan:  Home/Self Care  In-House Referral:     Discharge planning Services  CM Consult  Post Acute Care Choice:    Choice offered to:     DME Arranged:    DME Agency:     HH Arranged:    HH Agency:     Status of Service:  In process, will continue to follow  If discussed at Long Length of Stay Meetings, dates discussed:    Additional Comments:  Zenon Mayo, RN 08/11/2017, 12:16 PM

## 2017-08-11 NOTE — Progress Notes (Signed)
[facility] PROGRESS NOTE  Benjamin Keller VQM:[id number] DOB: [birth year] DOA: 08/10/2017  PCP: Fanny Bien, MD  Brief History/Interval Summary: 52 year old Caucasian male with a past medical history of diabetes on insulin presented with complaints of nausea vomiting.  This has been ongoing for about 2 days prior to admission.  He did not take his insulin during that time.  Patient was found to have evidence for diabetic ketoacidosis.  He was admitted to the intensive care unit.  Stabilized and then transferred to our care.  Reason for Visit: Diabetic ketoacidosis  Consultants: None  Procedures: None  Antibiotics: None  Subjective/Interval History: Feels better this morning.  No nausea vomiting since yesterday morning.  Denies any diarrhea.  No urinary complaints.  No skin rashes.  No joint pains.  No fever or chills.  He was started on Synjardy (combination of metformin and Empagliflozin) back in March.  He takes [place] as well.  ROS: Denies any shortness of breath  Objective:  Vital Signs  Vitals:   08/11/17 0800 08/11/17 0830 08/11/17 0900 08/11/17 1000  BP: 115/76  112/72 121/71  Pulse: 97  93 87  Resp: 19  20 (!) 21  Temp:  98.8 F (37.1 C)    TempSrc:  Oral    SpO2: 98%  93% 95%  Weight:      Height:        Intake/Output Summary (Last 24 hours) at 08/11/2017 1043 Last data filed at 08/11/2017 0900 Gross per 24 hour  Intake 6176.25 ml  Output 2350 ml  Net 3826.25 ml   Filed Weights   08/10/17 1022 08/11/17 0500  Weight: 86.2 kg (190 lb) 86 kg (189 lb 9.5 oz)    General appearance: alert, cooperative, appears stated age and no distress Head: Normocephalic, without obvious abnormality, atraumatic Resp: clear to auscultation bilaterally Cardio: regular rate and rhythm, S1, S2 normal, no murmur, click, rub or gallop GI: soft, non-tender; bowel sounds normal; no masses,  no organomegaly Extremities: extremities normal, atraumatic, no cyanosis or  edema Pulses: 2+ and symmetric Neurologic: No focal neurological deficits.   Lab Results:  Data Reviewed: I have personally reviewed following labs and imaging studies  CBC: Recent Labs  Lab 08/10/17 1030 08/10/17 1101 08/10/17 1316 08/10/17 1510 08/11/17 0556  WBC 17.5*  --   --  13.3* 10.7*  HGB 13.5 16.3 15.0 13.6 12.8*  HCT 50.0 48.0 44.0 45.5 37.0*  MCV 117.9*  --   --  104.4* 90.5  PLT 130*  --   --  92* 105*    Basic Metabolic Panel: Recent Labs  Lab 08/10/17 1053  08/10/17 1316 08/10/17 1510 08/10/17 1858 08/10/17 2211 08/11/17 0234 08/11/17 0556  NA 114*   < > 115* 122* 130* 137 139  --   K >7.5*   < > 7.0* 5.6* 4.4 3.9 3.6  --   CL 76*   < > 91* 89* 98 106 108  --   CO2 <7*  --   --  8* 14* 18* 19*  --   GLUCOSE 1,598*   < > >700* 1,171* 733* 374* 164*  --   BUN 71*   < > 74* 73* 69* 62* 56*  --   CREATININE 4.23*   < > 3.10* 4.32* 3.75* 2.76* 2.32*  --   CALCIUM 7.6*  --   --  7.2* 7.5* 7.9* 7.8*  --   MG  --   --   --   --   --   --   --  2.8*   < > = values in this interval not displayed.    GFR: Estimated Creatinine Clearance: 43 mL/min (A) (by C-G formula based on SCr of 2.32 mg/dL (H)).  Liver Function Tests: Recent Labs  Lab 08/10/17 1053  AST 31  ALT 30  ALKPHOS 65  BILITOT 2.5*  PROT 6.4*  ALBUMIN 3.9    Recent Labs  Lab 08/10/17 1053  LIPASE 274*    HbA1C: Recent Labs    08/10/17 1510  HGBA1C 8.3*    CBG: Recent Labs  Lab 08/11/17 0406 08/11/17 0505 08/11/17 0601 08/11/17 0701 08/11/17 0756  GLUCAP 120* 122* 139* 181* 152*     Recent Results (from the past 240 hour(s))  MRSA PCR Screening     Status: None   Collection Time: 08/10/17  5:01 PM  Result Value Ref Range Status   MRSA by PCR NEGATIVE NEGATIVE Final    Comment:        The GeneXpert MRSA Assay (FDA approved for NASAL specimens only), is one component of a comprehensive MRSA colonization surveillance program. It is not intended to diagnose  MRSA infection nor to guide or monitor treatment for MRSA infections. Performed at [facility], [place] [address] [place], [place] [address]       Radiology Studies: Dg Chest Port 1 View  Result Date: 08/10/2017 CLINICAL DATA:  Type 2 diabetes.  Nausea and vomiting. EXAM: PORTABLE CHEST 1 VIEW COMPARISON:  None. FINDINGS: The heart size and mediastinal contours are within normal limits. Both lungs are clear. The visualized skeletal structures are unremarkable. IMPRESSION: No active disease. Electronically Signed   By: Dorise Bullion III M.D   On: 08/10/2017 15:48     Medications:  Scheduled: . chlorhexidine  15 mL Mouth Rinse BID  . heparin  5,000 Units Subcutaneous Q8H  . insulin aspart  0-15 Units Subcutaneous Q4H  . insulin glargine  15 Units Subcutaneous Once  . [START ON 08/12/2017] insulin glargine  20 Units Subcutaneous Daily  . mouth rinse  15 mL Mouth Rinse q12n4p   Continuous: . sodium chloride     LZJ:QBHALPFXTKWIO, ondansetron (ZOFRAN) IV  Assessment/Plan:    Diabetic ketoacidosis This is in the setting of known diabetes.  Patient takes [place].  Synjardy consists of SGLT2 inhibitor which could have contributed to patient's diabetic ketoacidosis.  He should hold this medication going forward and discuss it further with his primary care provider.  No other precipitant factors identified.  No obvious evidence for infection.  Patient has responded to IV insulin.  His anion gap has closed.  He has been transitioned to subcutaneous insulin.  We will increase the dose of his Lantus.  We will advance his diet.  HbA1c 8.3.  Continue with IV fluids for now.  Acute renal failure with initial hyperkalemia Baseline renal function is not known.  Creatinine was 4.23 at admission.  Has been improving.  Continue IV fluids.  Monitor urine output.  Potassium level greater than 7.5 at admission.  Has improved now.  Leukocytosis Likely reactive in the setting of  acute illness and DKA.  No evidence for infection.  WBC count has improved.  Normocytic anemia Mild drop in hemoglobin is likely dilutional.  Continue to watch for now.  Mild thrombocytopenia Platelet counts noted to be low.  Mild drop noted.  We will stop his subcutaneous heparin for now.  Watch him closely.   DVT Prophylaxis: Currently on subcutaneous heparin which will be changed over to  SCDs.    Code Status: Full code Family Communication: Discussed with the patient Disposition Plan: Management as outlined above.  Advance diet.    LOS: 1 day   [facility] Pager [phone] 08/11/2017, 10:43 AM  If 7PM-7AM, please contact night-coverage at [email or web address], password [facility]

## 2017-08-12 LAB — CBC
HCT: 37.2 % — ABNORMAL LOW (ref 39.0–52.0)
HEMOGLOBIN: 12.7 g/dL — AB (ref 13.0–17.0)
MCH: 31.4 pg (ref 26.0–34.0)
MCHC: 34.1 g/dL (ref 30.0–36.0)
MCV: 91.9 fL (ref 78.0–100.0)
Platelets: 99 10*3/uL — ABNORMAL LOW (ref 150–400)
RBC: 4.05 MIL/uL — ABNORMAL LOW (ref 4.22–5.81)
RDW: 12.5 % (ref 11.5–15.5)
WBC: 6.9 10*3/uL (ref 4.0–10.5)

## 2017-08-12 LAB — BASIC METABOLIC PANEL
Anion gap: 12 (ref 5–15)
BUN: 19 mg/dL (ref 6–20)
CALCIUM: 8.2 mg/dL — AB (ref 8.9–10.3)
CO2: 22 mmol/L (ref 22–32)
Chloride: 108 mmol/L (ref 98–111)
Creatinine, Ser: 1.18 mg/dL (ref 0.61–1.24)
GFR calc Af Amer: >60 mL/min (ref >60)
GFR calc non Af Amer: >60 mL/min (ref >60)
GLUCOSE: 196 mg/dL — AB (ref 70–99)
Potassium: 3.5 mmol/L (ref 3.5–5.1)
Sodium: 142 mmol/L (ref 135–145)

## 2017-08-12 LAB — GLUCOSE, CAPILLARY: GLUCOSE-CAPILLARY: 205 mg/dL — AB (ref 70–99)

## 2017-08-12 NOTE — Discharge Summary (Addendum)
[facility]  Physician Discharge Summary   Patient ID: Benjamin Keller MRN: [id number] DOB/AGE: [birth year] 50 y.o.  Admit date: 08/10/2017 Discharge date: 08/12/2017  PCP: Fanny Bien, MD  DISCHARGE DIAGNOSES:  Diabetic ketoacidosis, resolved  RECOMMENDATIONS FOR OUTPATIENT FOLLOW UP: 1. Has been asked to stop taking Synjardy  2. PCP to determine further management of diabetes 3. May need further work-up for thrombocytopenia if not done previously 4. Check Lipase at follow up. Consider outpatient work up if Lipase level remains high.   DISCHARGE CONDITION: fair  Diet recommendation: 100  Filed Weights   08/10/17 1022 08/11/17 0500 08/12/17 0508  Weight: 86.2 kg (190 lb) 86 kg (189 lb 9.5 oz) 85.7 kg (189 lb)    INITIAL HISTORY: 52 year old Caucasian male with a past medical history of diabetes on insulin presented with complaints of nausea vomiting.  This has been ongoing for about 2 days prior to admission.  He did not take his insulin during that time.  Patient was found to have evidence for diabetic ketoacidosis.  He was admitted to the intensive care unit.  Stabilized and then transferred to our care.   HOSPITAL COURSE:   Diabetic ketoacidosis in the setting of insulin-dependent diabetes mellitus This is in the setting of known diabetes.  Patient takes [place].  Synjardy consists of SGLT2 inhibitor which could have contributed to patient's diabetic ketoacidosis.  He should hold this medication going forward and discuss it further with his primary care provider.  No other precipitant factors identified.  No obvious evidence for infection.  Patient has responded to IV insulin.  His anion gap closed.  He was transitioned to subcutaneous insulin.    Diet was advanced. HbA1c 8.3.    He has been told to continue just the [place] at home.  Acute renal failure with initial hyperkalemia Baseline renal function is not known.  Creatinine was 4.23 at  admission.    Patient was given IV fluids with improvement.  Renal function is back to normal.  Potassium level greater than 7.5 at admission.  This is also improved.  Leukocytosis Likely reactive in the setting of acute illness and DKA.  No evidence for infection.  WBC count has improved.  Normocytic anemia Mild drop in hemoglobin is likely dilutional.    Mild thrombocytopenia Platelet counts noted to be low. Etiology unclear.  May need outpatient work-up if not done previously.  Elevated Lipase Elevated lipase level was noted at the time of admission. Unclear significance. Elevated lipase level could have been due to renal failure and n/v. Patient's symptoms were thought to be more secondary to DKA rather than pancreatitis. Patient did not have any abdominal pain.  Can be rechecked as an outpatient.   Stable.  Okay for discharge home today.    PERTINENT LABS:  The results of significant diagnostics from this hospitalization (including imaging, microbiology, ancillary and laboratory) are listed below for reference.    Microbiology: Recent Results (from the past 240 hour(s))  MRSA PCR Screening     Status: None   Collection Time: 08/10/17  5:01 PM  Result Value Ref Range Status   MRSA by PCR NEGATIVE NEGATIVE Final    Comment:        The GeneXpert MRSA Assay (FDA approved for NASAL specimens only), is one component of a comprehensive MRSA colonization surveillance program. It is not intended to diagnose MRSA infection nor to guide or monitor treatment for MRSA infections. Performed at [facility], [place]  [address] [place], [place] [address]      Labs: Basic Metabolic Panel: Recent Labs  Lab 08/10/17 1858 08/10/17 2211 08/11/17 0234 08/11/17 0556 08/11/17 1127 08/12/17 0413  NA 130* 137 139  --  138 142  K 4.4 3.9 3.6  --  4.1 3.5  CL 98 106 108  --  106 108  CO2 14* 18* 19*  --  19* 22  GLUCOSE 733* 374* 164*  --  350* 196*  BUN 69* 62* 56*  --  43*  19  CREATININE 3.75* 2.76* 2.32*  --  1.69* 1.18  CALCIUM 7.5* 7.9* 7.8*  --  7.8* 8.2*  MG  --   --   --  2.8*  --   --    Liver Function Tests: Recent Labs  Lab 08/10/17 1053  AST 31  ALT 30  ALKPHOS 65  BILITOT 2.5*  PROT 6.4*  ALBUMIN 3.9   Recent Labs  Lab 08/10/17 1053  LIPASE 274*   CBC: Recent Labs  Lab 08/10/17 1030 08/10/17 1101 08/10/17 1316 08/10/17 1510 08/11/17 0556 08/12/17 0413  WBC 17.5*  --   --  13.3* 10.7* 6.9  HGB 13.5 16.3 15.0 13.6 12.8* 12.7*  HCT 50.0 48.0 44.0 45.5 37.0* 37.2*  MCV 117.9*  --   --  104.4* 90.5 91.9  PLT 130*  --   --  92* 105* 99*    CBG: Recent Labs  Lab 08/11/17 0756 08/11/17 1208 08/11/17 1632 08/11/17 2313 08/12/17 0808  GLUCAP 152* 351* 168* 196* 205*     IMAGING STUDIES Dg Chest Port 1 View  Result Date: 08/10/2017 CLINICAL DATA:  Type 2 diabetes.  Nausea and vomiting. EXAM: PORTABLE CHEST 1 VIEW COMPARISON:  None. FINDINGS: The heart size and mediastinal contours are within normal limits. Both lungs are clear. The visualized skeletal structures are unremarkable. IMPRESSION: No active disease. Electronically Signed   By: Dorise Bullion III M.D   On: 08/10/2017 15:48    DISCHARGE EXAMINATION: Vitals:   08/11/17 1855 08/11/17 2155 08/12/17 0508 08/12/17 0511  BP: 134/77 129/79  138/86  Pulse: 83 78  84  Resp: 18 16  16   Temp: 98.6 F (37 C) 98.9 F (37.2 C)  99.5 F (37.5 C)  TempSrc: Oral Oral  Oral  SpO2: 98% 98%  97%  Weight:   85.7 kg (189 lb)   Height:       General appearance: alert, cooperative, appears stated age and no distress Resp: clear to auscultation bilaterally Cardio: regular rate and rhythm, S1, S2 normal, no murmur, click, rub or gallop GI: soft, non-tender; bowel sounds normal; no masses,  no organomegaly  DISPOSITION: Home  Discharge Instructions    Call MD for:  difficulty breathing, headache or visual disturbances   Complete by:  As directed    Call MD for:  extreme  fatigue   Complete by:  As directed    Call MD for:  persistant dizziness or light-headedness   Complete by:  As directed    Call MD for:  persistant nausea and vomiting   Complete by:  As directed    Call MD for:  severe uncontrolled pain   Complete by:  As directed    Call MD for:  temperature >100.4   Complete by:  As directed    Diet Carb Modified   Complete by:  As directed    Discharge instructions   Complete by:  As directed    Please follow up with your  PCP within the next 5 days. Monitor your glucose levels at home, three times daily before each meal, and take the readings to your PCP. If levels stay consistently higher than 200, increase the dose of your Tresiba by 2 units. Stop taking Synjardy for now. Stop taking motrin as it can hurt your kidneys.  You were cared for by a hospitalist during your hospital stay. If you have any questions about your discharge medications or the care you received while you were in the hospital after you are discharged, you can call the unit and asked to speak with the hospitalist on call if the hospitalist that took care of you is not available. Once you are discharged, your primary care physician will handle any further medical issues. Please note that NO REFILLS for any discharge medications will be authorized once you are discharged, as it is imperative that you return to your primary care physician (or establish a relationship with a primary care physician if you do not have one) for your aftercare needs so that they can reassess your need for medications and monitor your lab values. If you do not have a primary care physician, you can call [phone] for a physician referral.   Increase activity slowly   Complete by:  As directed        Allergies as of 08/12/2017   No Known Allergies     Medication List    STOP taking these medications   ibuprofen 800 MG tablet Commonly known as:  ADVIL,MOTRIN   SYNJARDY 5-500 MG Tabs Generic drug:   Empagliflozin-metFORMIN HCl     TAKE these medications   aspirin EC 81 MG tablet Take 81 mg by mouth daily.   atorvastatin 10 MG tablet Commonly known as:  LIPITOR Take 10 mg by mouth at bedtime.   azelastine 0.1 % nasal spray Commonly known as:  ASTELIN Place 2 sprays into both nostrils 2 (two) times daily.   b complex vitamins tablet Take 1 tablet by mouth daily.   Fish Oil 1000 MG Caps Take 1,000 mg by mouth daily.   loratadine 10 MG tablet Commonly known as:  CLARITIN Take 10 mg by mouth daily as needed for allergies.   multivitamin with minerals Tabs tablet Take 1 tablet by mouth daily.   ondansetron 4 MG disintegrating tablet Commonly known as:  ZOFRAN-ODT Take 8 mg by mouth every 8 (eight) hours as needed for nausea or vomiting.   tiZANidine 4 MG tablet Commonly known as:  ZANAFLEX Take 2-8 mg by mouth 3 (three) times daily as needed for muscle spasms.   TRESIBA FLEXTOUCH 200 UNIT/ML Sopn Generic drug:  Insulin Degludec Inject 20 Units into the skin daily.        Follow-up Information    Fanny Bien, MD. Schedule an appointment as soon as possible for a visit in 3 day(s).   Specialty:  Family Medicine Contact information: [address] [place] [address] [phone]           TOTAL DISCHARGE TIME: 35 mins  [facility] Pager [phone]  08/12/2017, 3:13 PM

## 2017-08-12 NOTE — Progress Notes (Signed)
Discharge instructions given to Pt and his partner.  Pt given his instructions for home.   Pt discharged home with his partner. Pt left ambulatory.

## 2017-08-12 NOTE — Discharge Instructions (Signed)
Diabetic Ketoacidosis °Diabetic ketoacidosis is a life-threatening complication of diabetes. If it is not treated, it can cause severe dehydration and organ damage and can lead to a coma or death. °What are the causes? °This condition develops when there is not enough of the hormone insulin in the body. Insulin helps the body to break down sugar for energy. Without insulin, the body cannot break down sugar, so it breaks down fats instead. This leads to the production of acids that are called ketones. Ketones are poisonous at high levels. °This condition can be triggered by: °· Stress on the body that is brought on by an illness. °· Medicines that raise blood glucose levels. °· Not taking diabetes medicine. ° °What are the signs or symptoms? °Symptoms of this condition include: °· Fatigue. °· Weight loss. °· Excessive thirst. °· Light-headedness. °· Fruity or sweet-smelling breath. °· Excessive urination. °· Vision changes. °· Confusion or irritability. °· Nausea. °· Vomiting. °· Rapid breathing. °· Abdominal pain. °· Feeling flushed. ° °How is this diagnosed? °This condition is diagnosed based on a medical history, a physical exam, and blood tests. You may also have a urine test that checks for ketones. °How is this treated? °This condition may be treated with: °· Fluid replacement. This may be done to correct dehydration. °· Insulin injections. These may be given through the skin or through an IV tube. °· Electrolyte replacement. Electrolytes, such as potassium and sodium, may be given in pill form or through an IV tube. °· Antibiotic medicines. These may be prescribed if your condition was caused by an infection. ° °Follow these instructions at home: °Eating and drinking °· Drink enough fluids to keep your urine clear or pale yellow. °· If you cannot eat, alternate between drinking fluids with sugar (such as juice) and salty fluids (such as broth or bouillon). °· If you can eat, follow your usual diet and drink  sugar-free liquids, such as water. °Other Instructions ° °· Take insulin as directed by your health care provider. Do not skip insulin injections. Do not use expired insulin. °· If your blood sugar is over 240 mg/dL, monitor your urine ketones every 4-6 hours. °· If you were prescribed an antibiotic medicine, finish all of it even if you start to feel better. °· Rest and exercise only as directed by your health care provider. °· If you get sick, call your health care provider and begin treatment quickly. Your body often needs extra insulin to fight an illness. °· Check your blood glucose levels regularly. If your blood glucose is high, drink plenty of fluids. This helps to flush out ketones. °Contact a health care provider if: °· Your blood glucose level is too high or too low. °· You have ketones in your urine. °· You have a fever. °· You cannot eat. °· You cannot tolerate fluids. °· You have been vomiting for more than 2 hours. °· You continue to have symptoms of this condition. °· You develop new symptoms. °Get help right away if: °· Your blood glucose levels continue to be high (elevated). °· Your monitor reads “high” even when you are taking insulin. °· You faint. °· You have chest pain. °· You have trouble breathing. °· You have a sudden, severe headache. °· You have sudden weakness in one arm or one leg. °· You have sudden trouble speaking or swallowing. °· You have vomiting or diarrhea that gets worse after 3 hours. °· You feel severely fatigued. °· You have trouble thinking. °· You   have abdominal pain. °· You are severely dehydrated. Symptoms of severe dehydration include: °? Extreme thirst. °? Dry mouth. °? Blue lips. °? Cold hands and feet. °? Rapid breathing. °This information is not intended to replace advice given to you by your health care provider. Make sure you discuss any questions you have with your health care provider. °Document Released: 01/14/2000 Document Revised: 06/24/2015 Document  Reviewed: 12/24/2013 °[employer] Interactive Patient Education © 2017 [employer] ° °

## 2017-08-14 DIAGNOSIS — R1110 Vomiting, unspecified: Secondary | ICD-10-CM | POA: Diagnosis not present

## 2017-08-14 DIAGNOSIS — E860 Dehydration: Secondary | ICD-10-CM | POA: Diagnosis not present

## 2017-08-14 DIAGNOSIS — E1310 Other specified diabetes mellitus with ketoacidosis without coma: Secondary | ICD-10-CM | POA: Diagnosis not present

## 2017-08-14 DIAGNOSIS — R110 Nausea: Secondary | ICD-10-CM | POA: Diagnosis not present

## 2017-08-17 DIAGNOSIS — E119 Type 2 diabetes mellitus without complications: Secondary | ICD-10-CM | POA: Diagnosis not present

## 2017-08-17 DIAGNOSIS — E860 Dehydration: Secondary | ICD-10-CM | POA: Diagnosis not present

## 2017-08-17 DIAGNOSIS — R109 Unspecified abdominal pain: Secondary | ICD-10-CM | POA: Diagnosis not present

## 2017-08-17 DIAGNOSIS — Z6825 Body mass index (BMI) 25.0-25.9, adult: Secondary | ICD-10-CM | POA: Diagnosis not present

## 2017-08-17 DIAGNOSIS — E1310 Other specified diabetes mellitus with ketoacidosis without coma: Secondary | ICD-10-CM | POA: Diagnosis not present

## 2017-08-21 DIAGNOSIS — E108 Type 1 diabetes mellitus with unspecified complications: Secondary | ICD-10-CM | POA: Diagnosis not present

## 2017-08-21 DIAGNOSIS — Z6826 Body mass index (BMI) 26.0-26.9, adult: Secondary | ICD-10-CM | POA: Diagnosis not present

## 2017-08-21 DIAGNOSIS — E119 Type 2 diabetes mellitus without complications: Secondary | ICD-10-CM | POA: Diagnosis not present

## 2017-09-13 DIAGNOSIS — E119 Type 2 diabetes mellitus without complications: Secondary | ICD-10-CM | POA: Diagnosis not present

## 2017-09-13 DIAGNOSIS — E782 Mixed hyperlipidemia: Secondary | ICD-10-CM | POA: Diagnosis not present

## 2017-09-13 DIAGNOSIS — E039 Hypothyroidism, unspecified: Secondary | ICD-10-CM | POA: Diagnosis not present

## 2017-09-13 DIAGNOSIS — E860 Dehydration: Secondary | ICD-10-CM | POA: Diagnosis not present

## 2017-09-17 DIAGNOSIS — E860 Dehydration: Secondary | ICD-10-CM | POA: Diagnosis not present

## 2017-09-17 DIAGNOSIS — E119 Type 2 diabetes mellitus without complications: Secondary | ICD-10-CM | POA: Diagnosis not present

## 2017-09-17 DIAGNOSIS — Z6827 Body mass index (BMI) 27.0-27.9, adult: Secondary | ICD-10-CM | POA: Diagnosis not present

## 2017-09-17 DIAGNOSIS — Z23 Encounter for immunization: Secondary | ICD-10-CM | POA: Diagnosis not present

## 2017-11-26 ENCOUNTER — Ambulatory Visit (INDEPENDENT_AMBULATORY_CARE_PROVIDER_SITE_OTHER): Payer: BLUE CROSS/BLUE SHIELD

## 2017-11-26 ENCOUNTER — Ambulatory Visit (INDEPENDENT_AMBULATORY_CARE_PROVIDER_SITE_OTHER): Payer: BLUE CROSS/BLUE SHIELD | Admitting: Podiatry

## 2017-11-26 ENCOUNTER — Encounter: Payer: Self-pay | Admitting: Podiatry

## 2017-11-26 VITALS — BP 143/94 | HR 63 | Resp 16 | Ht 73.0 in | Wt 190.0 lb

## 2017-11-26 DIAGNOSIS — M21619 Bunion of unspecified foot: Secondary | ICD-10-CM | POA: Diagnosis not present

## 2017-11-26 DIAGNOSIS — M722 Plantar fascial fibromatosis: Secondary | ICD-10-CM | POA: Diagnosis not present

## 2017-11-26 DIAGNOSIS — M79672 Pain in left foot: Secondary | ICD-10-CM

## 2017-11-26 DIAGNOSIS — M79671 Pain in right foot: Secondary | ICD-10-CM

## 2017-11-26 DIAGNOSIS — M21612 Bunion of left foot: Secondary | ICD-10-CM

## 2017-11-26 MED ORDER — DICLOFENAC SODIUM 75 MG PO TBEC: 75.0000 mg | DELAYED_RELEASE_TABLET | Freq: Two times a day (BID) | ORAL | 2 refills | Status: DC

## 2017-11-26 MED ORDER — TRIAMCINOLONE ACETONIDE 10 MG/ML IJ SUSP
10.0000 mg | Freq: Once | INTRAMUSCULAR | Status: AC
  Administered 2017-11-26: 10 mg

## 2017-11-26 NOTE — Patient Instructions (Signed)
Plantar Fasciitis (Heel Spur Syndrome) with Rehab The plantar fascia is a fibrous, ligament-like, soft-tissue structure that spans the bottom of the foot. Plantar fasciitis is a condition that causes pain in the foot due to inflammation of the tissue. SYMPTOMS   Pain and tenderness on the underneath side of the foot.  Pain that worsens with standing or walking. CAUSES  Plantar fasciitis is caused by irritation and injury to the plantar fascia on the underneath side of the foot. Common mechanisms of injury include:  Direct trauma to bottom of the foot.  Damage to a small nerve that runs under the foot where the main fascia attaches to the heel bone.  Stress placed on the plantar fascia due to bone spurs. RISK INCREASES WITH:   Activities that place stress on the plantar fascia (running, jumping, pivoting, or cutting).  Poor strength and flexibility.  Improperly fitted shoes.  Tight calf muscles.  Flat feet.  Failure to warm-up properly before activity.  Obesity. PREVENTION  Warm up and stretch properly before activity.  Allow for adequate recovery between workouts.  Maintain physical fitness:  Strength, flexibility, and endurance.  Cardiovascular fitness.  Maintain a health body weight.  Avoid stress on the plantar fascia.  Wear properly fitted shoes, including arch supports for individuals who have flat feet.  PROGNOSIS  If treated properly, then the symptoms of plantar fasciitis usually resolve without surgery. However, occasionally surgery is necessary.  RELATED COMPLICATIONS   Recurrent symptoms that may result in a chronic condition.  Problems of the lower back that are caused by compensating for the injury, such as limping.  Pain or weakness of the foot during push-off following surgery.  Chronic inflammation, scarring, and partial or complete fascia tear, occurring more often from repeated injections.  TREATMENT  Treatment initially involves the  use of ice and medication to help reduce pain and inflammation. The use of strengthening and stretching exercises may help reduce pain with activity, especially stretches of the Achilles tendon. These exercises may be performed at home or with a therapist. Your caregiver may recommend that you use heel cups of arch supports to help reduce stress on the plantar fascia. Occasionally, corticosteroid injections are given to reduce inflammation. If symptoms persist for greater than 6 months despite non-surgical (conservative), then surgery may be recommended.   MEDICATION   If pain medication is necessary, then nonsteroidal anti-inflammatory medications, such as aspirin and ibuprofen, or other minor pain relievers, such as acetaminophen, are often recommended.  Do not take pain medication within 7 days before surgery.  Prescription pain relievers may be given if deemed necessary by your caregiver. Use only as directed and only as much as you need.  Corticosteroid injections may be given by your caregiver. These injections should be reserved for the most serious cases, because they may only be given a certain number of times.  HEAT AND COLD  Cold treatment (icing) relieves pain and reduces inflammation. Cold treatment should be applied for 10 to 15 minutes every 2 to 3 hours for inflammation and pain and immediately after any activity that aggravates your symptoms. Use ice packs or massage the area with a piece of ice (ice massage).  Heat treatment may be used prior to performing the stretching and strengthening activities prescribed by your caregiver, physical therapist, or athletic trainer. Use a heat pack or soak the injury in warm water.  SEEK IMMEDIATE MEDICAL CARE IF:  Treatment seems to offer no benefit, or the condition worsens.  Any medications   produce adverse side effects.  EXERCISES- RANGE OF MOTION (ROM) AND STRETCHING EXERCISES - Plantar Fasciitis (Heel Spur Syndrome) These exercises  may help you when beginning to rehabilitate your injury. Your symptoms may resolve with or without further involvement from your physician, physical therapist or athletic trainer. While completing these exercises, remember:   Restoring tissue flexibility helps normal motion to return to the joints. This allows healthier, less painful movement and activity.  An effective stretch should be held for at least 30 seconds.  A stretch should never be painful. You should only feel a gentle lengthening or release in the stretched tissue.  RANGE OF MOTION - Toe Extension, Flexion  Sit with your right / left leg crossed over your opposite knee.  Grasp your toes and gently pull them back toward the top of your foot. You should feel a stretch on the bottom of your toes and/or foot.  Hold this stretch for 10 seconds.  Now, gently pull your toes toward the bottom of your foot. You should feel a stretch on the top of your toes and or foot.  Hold this stretch for 10 seconds. Repeat  times. Complete this stretch 3 times per day.   RANGE OF MOTION - Ankle Dorsiflexion, Active Assisted  Remove shoes and sit on a chair that is preferably not on a carpeted surface.  Place right / left foot under knee. Extend your opposite leg for support.  Keeping your heel down, slide your right / left foot back toward the chair until you feel a stretch at your ankle or calf. If you do not feel a stretch, slide your bottom forward to the edge of the chair, while still keeping your heel down.  Hold this stretch for 10 seconds. Repeat 3 times. Complete this stretch 2 times per day.   STRETCH  Gastroc, Standing  Place hands on wall.  Extend right / left leg, keeping the front knee somewhat bent.  Slightly point your toes inward on your back foot.  Keeping your right / left heel on the floor and your knee straight, shift your weight toward the wall, not allowing your back to arch.  You should feel a gentle stretch  in the right / left calf. Hold this position for 10 seconds. Repeat 3 times. Complete this stretch 2 times per day.  STRETCH  Soleus, Standing  Place hands on wall.  Extend right / left leg, keeping the other knee somewhat bent.  Slightly point your toes inward on your back foot.  Keep your right / left heel on the floor, bend your back knee, and slightly shift your weight over the back leg so that you feel a gentle stretch deep in your back calf.  Hold this position for 10 seconds. Repeat 3 times. Complete this stretch 2 times per day.  STRETCH  Gastrocsoleus, Standing  Note: This exercise can place a lot of stress on your foot and ankle. Please complete this exercise only if specifically instructed by your caregiver.   Place the ball of your right / left foot on a step, keeping your other foot firmly on the same step.  Hold on to the wall or a rail for balance.  Slowly lift your other foot, allowing your body weight to press your heel down over the edge of the step.  You should feel a stretch in your right / left calf.  Hold this position for 10 seconds.  Repeat this exercise with a slight bend in your right /   left knee. Repeat 3 times. Complete this stretch 2 times per day.   STRENGTHENING EXERCISES - Plantar Fasciitis (Heel Spur Syndrome)  These exercises may help you when beginning to rehabilitate your injury. They may resolve your symptoms with or without further involvement from your physician, physical therapist or athletic trainer. While completing these exercises, remember:   Muscles can gain both the endurance and the strength needed for everyday activities through controlled exercises.  Complete these exercises as instructed by your physician, physical therapist or athletic trainer. Progress the resistance and repetitions only as guided.  STRENGTH - Towel Curls  Sit in a chair positioned on a non-carpeted surface.  Place your foot on a towel, keeping your heel  on the floor.  Pull the towel toward your heel by only curling your toes. Keep your heel on the floor. Repeat 3 times. Complete this exercise 2 times per day.  STRENGTH - Ankle Inversion  Secure one end of a rubber exercise band/tubing to a fixed object (table, pole). Loop the other end around your foot just before your toes.  Place your fists between your knees. This will focus your strengthening at your ankle.  Slowly, pull your big toe up and in, making sure the band/tubing is positioned to resist the entire motion.  Hold this position for 10 seconds.  Have your muscles resist the band/tubing as it slowly pulls your foot back to the starting position. Repeat 3 times. Complete this exercises 2 times per day.  Document Released: 01/16/2005 Document Revised: 04/10/2011 Document Reviewed: 04/30/2008 [employer] Patient Information 2014 [employer]. Bunion A bunion is a bump on the base of the big toe that forms when the bones of the big toe joint move out of position. Bunions may be small at first, but they often get larger over time. The can make walking painful. What are the causes? A bunion may be caused by:  Wearing narrow or pointed shoes that force the big toe to press against the other toes.  Abnormal foot development that causes the foot to roll inward (pronate).  Changes in the foot that are caused by certain diseases, such as rheumatoid arthritis and polio.  A foot injury.  What increases the risk? The following factors may make you more likely to develop this condition:  Wearing shoes that squeeze the toes together.  Having certain diseases, such as: ? Rheumatoid arthritis. ? Polio. ? Cerebral palsy.  Having family members who have bunions.  Being born with a foot deformity, such as flat feet or low arches.  Doing activities that put a lot of pressure on the feet, such as ballet dancing.  What are the signs or symptoms? The main symptom of a bunion is a  noticeable bump on the big toe. Other symptoms may include:  Pain.  Swelling around the big toe.  Redness and inflammation.  Thick or hardened skin on the big toe or between the toes.  Stiffness or loss of motion in the big toe.  Trouble with walking.  How is this diagnosed? A bunion may be diagnosed based on your symptoms, medical history, and activities. You may have tests, such as:  X-rays. These allow your health care provider to check the position of the bones in your foot and look for damage to your joint. They also help your health care provider to determine the severity of your bunion and the best way to treat it.  Joint aspiration. In this test, a sample of fluid is removed from   the toe joint. This test, which may be done if you are in a lot of pain, helps to rule out diseases that cause painful swelling of the joints, such as arthritis.  How is this treated? There is no cure for a bunion, but treatment can help to prevent a bunion from getting worse. Treatment depends on the severity of your symptoms. Your health care provider may recommend:  Wearing shoes that have a wide toe box.  Using bunion pads to cushion the affected area.  Taping your toes together to keep them in a normal position.  Placing a device inside your shoe (orthotics) to help reduce pressure on your toe joint.  Taking medicine to ease pain, inflammation, and swelling.  Applying heat or ice to the affected area.  Doing stretching exercises.  Surgery to remove scar tissue and move the toes back into their normal position. This treatment is rare.  Follow these instructions at home:  Support your toe joint with proper footwear, shoe padding, or taping as told by your health care provider.  Take over-the-counter and prescription medicines only as told by your health care provider.  If directed, apply ice to the injured area: ? Put ice in a plastic bag. ? Place a towel between your skin and the  bag. ? Leave the ice on for 20 minutes, 2-3 times per day.  If directed, apply heat to the affected area before you exercise. Use the heat source that your health care provider recommends, such as a moist heat pack or a heating pad. ? Place a towel between your skin and the heat source. ? Leave the heat on for 20-30 minutes. ? Remove the heat if your skin turns bright red. This is especially important if you are unable to feel pain, heat, or cold. You may have a greater risk of getting burned.  Do exercises as told by your health care provider.  Keep all follow-up visits as told by your health care provider. Contact a health care provider if:  Your symptoms get worse.  Your symptoms do not improve in 2 weeks. Get help right away if:  You have severe pain and trouble with walking. This information is not intended to replace advice given to you by your health care provider. Make sure you discuss any questions you have with your health care provider. Document Released: 01/16/2005 Document Revised: 06/24/2015 Document Reviewed: 08/16/2014 Elsevier Interactive Patient Education  2018 [employer]  

## 2017-11-26 NOTE — Progress Notes (Signed)
Subjective:   Patient ID: Benjamin Keller, male   DOB: 52 y.o.   MRN: [id number]   HPI Patient presents stating that the left heel has been hurting for approximately 2 months with no history of injury but had returned from [facility].  Patient it is worse in the morning and after periods of sitting and is gradually hurting more all day and is also starting to develop increased pain around the big toe joint right with history of bunion with family history also of problems.  Patient does not smoke and likes to be active   Review of Systems  All other systems reviewed and are negative.       Objective:  Physical Exam  Constitutional: He appears well-developed and well-nourished.  Cardiovascular: Intact distal pulses.  Pulmonary/Chest: Effort normal.  Musculoskeletal: Normal range of motion.  Neurological: He is alert.  Skin: Skin is warm.  Nursing note and vitals reviewed.   Neurovascular status found to be intact muscle strength is adequate range of motion within normal limits with patient found to have exquisite discomfort plantar aspect left heel at the insertional point of the tendon into the calcaneus with fluid buildup around the medial band and also is noted to have hyperostosis medial aspect first metatarsal head right that is red and painful when palpated.  Patient is noted to have good digital perfusion is well oriented x3     Assessment:  Acute plantar fasciitis left with patient noted to have structural bunion deformity with redness and pain right     Plan:  H&P discussed both conditions.  Today we will get a focus on the left and I injected the plantar fascia 3 mg Kenalog 5 mg Xylocaine applied fascial brace with instructions on usage and gave instructions for physical therapy shoe gear modifications.  Discussed bunion and we will go over that in the next 2 weeks when he returns and I also will discuss his diabetes in greater length at that time  X-ray was negative for  signs of stress fracture left with large structural bunion deformity noted right with deviation of the hallux against the second toe

## 2017-11-26 NOTE — Progress Notes (Signed)
   Subjective:    Patient ID: Benjamin Keller, male    DOB: 08-08-1965, 52 y.o.   MRN: [id number]  HPI    Review of Systems  Musculoskeletal: Positive for arthralgias and myalgias.  All other systems reviewed and are negative.      Objective:   Physical Exam        Assessment & Plan:

## 2017-12-10 ENCOUNTER — Ambulatory Visit (INDEPENDENT_AMBULATORY_CARE_PROVIDER_SITE_OTHER): Payer: BLUE CROSS/BLUE SHIELD | Admitting: Podiatry

## 2017-12-10 DIAGNOSIS — M21611 Bunion of right foot: Secondary | ICD-10-CM

## 2017-12-10 DIAGNOSIS — M79671 Pain in right foot: Secondary | ICD-10-CM

## 2017-12-10 DIAGNOSIS — M21619 Bunion of unspecified foot: Secondary | ICD-10-CM

## 2017-12-10 DIAGNOSIS — M722 Plantar fascial fibromatosis: Secondary | ICD-10-CM | POA: Diagnosis not present

## 2017-12-10 NOTE — Progress Notes (Signed)
Subjective:   Patient ID: Benjamin Keller, male   DOB: 52 y.o.   MRN: [id number]   HPI Patient states that heel is doing much better and he wants to get this bunion fixed and needs to get it done this year due to his insurance and his time off   ROS      Objective:  Physical Exam  Neurovascular status intact with patient's left heel doing very well with structural bunion deformity right.  I discussed his diabetes and is had it for approximately 5 years and his A1c has average between 6-1/2 and 7 is currently around 7.2.  Patient states has not had any other symptoms associated with diabetes under good control and he does have redness and pain around the first metatarsal head right     Assessment:  Plantar fasciitis left over right with structural bunion deformity with pain right     Plan:  H&P condition reviewed and we discussed physical therapy for the left heel and I did scanned for custom orthotics to lift the arch up.  I then went ahead and discussed the bunion right and he wants to have this done and I allowed patient to read consent form going over return to do treatments and complications associated with correction.  Patient is willing to accept risk of surgery understanding everything is outlined and at this time signed consent form for structural bunion correction right and is scheduled for outpatient surgery.  Patient is encouraged to call with any questions he might have prior to procedure and he understands total recovery can take 6 months to 1 year and I did dispense air fracture walker today that I want him to get used to it prior to the procedure.  I also explained will probably not be able to get complete correction of the deformity but we should be able to put it into a much better functioning position

## 2017-12-10 NOTE — Patient Instructions (Signed)
Pre-Operative Instructions  Congratulations, you have decided to take an important step towards improving your quality of life.  You can be assured that the doctors and staff at [facility] will be with you every step of the way.  Here are some important things you should know:  1. Plan to be at the surgery center/hospital at least 1 (one) hour prior to your scheduled time, unless otherwise directed by the surgical center/hospital staff.  You must have a responsible adult accompany you, remain during the surgery and drive you home.  Make sure you have directions to the surgical center/hospital to ensure you arrive on time. 2. If you are having surgery at [facility] or [facility], you will need a copy of your medical history and physical form from your family physician within one month prior to the date of surgery. We will give you a form for your primary physician to complete.  3. We make every effort to accommodate the date you request for surgery.  However, there are times where surgery dates or times have to be moved.  We will contact you as soon as possible if a change in schedule is required.   4. No aspirin/ibuprofen for one week before surgery.  If you are on aspirin, any non-steroidal anti-inflammatory medications (Mobic, Aleve, Ibuprofen) should not be taken seven (7) days prior to your surgery.  You make take Tylenol for pain prior to surgery.  5. Medications - If you are taking daily heart and blood pressure medications, seizure, reflux, allergy, asthma, anxiety, pain or diabetes medications, make sure you notify the surgery center/hospital before the day of surgery so they can tell you which medications you should take or avoid the day of surgery. 6. No food or drink after midnight the night before surgery unless directed otherwise by surgical center/hospital staff. 7. No alcoholic beverages 24-hours prior to surgery.  No smoking 24-hours prior or 24-hours after  surgery. 8. Wear loose pants or shorts. They should be loose enough to fit over bandages, boots, and casts. 9. Don't wear slip-on shoes. Sneakers are preferred. 10. Bring your boot with you to the surgery center/hospital.  Also bring crutches or a walker if your physician has prescribed it for you.  If you do not have this equipment, it will be provided for you after surgery. 11. If you have not been contacted by the surgery center/hospital by the day before your surgery, call to confirm the date and time of your surgery. 12. Leave-time from work may vary depending on the type of surgery you have.  Appropriate arrangements should be made prior to surgery with your employer. 13. Prescriptions will be provided immediately following surgery by your doctor.  Fill these as soon as possible after surgery and take the medication as directed. Pain medications will not be refilled on weekends and must be approved by the doctor. 14. Remove nail polish on the operative foot and avoid getting pedicures prior to surgery. 15. Wash the night before surgery.  The night before surgery wash the foot and leg well with water and the antibacterial soap provided. Be sure to pay special attention to beneath the toenails and in between the toes.  Wash for at least three (3) minutes. Rinse thoroughly with water and dry well with a towel.  Perform this wash unless told not to do so by your physician.  Enclosed: 1 Ice pack (please put in freezer the night before surgery)   1 Hibiclens skin cleaner     Pre-op instructions  If you have any questions regarding the instructions, please do not hesitate to call our office.  [place]: [address], , [place] [address] -- [phone]  [place]: [address] [place], [place] [address] -- [phone]  [place]: [address]  [place], [place] [address] -- [phone]  [place]: [address], [address], [place], [place] [address] -- [phone]  Website:  [email or web address] 

## 2017-12-17 DIAGNOSIS — Z0000 Encounter for general adult medical examination without abnormal findings: Secondary | ICD-10-CM | POA: Diagnosis not present

## 2017-12-17 DIAGNOSIS — Z114 Encounter for screening for human immunodeficiency virus [HIV]: Secondary | ICD-10-CM | POA: Diagnosis not present

## 2017-12-17 DIAGNOSIS — Z13220 Encounter for screening for lipoid disorders: Secondary | ICD-10-CM | POA: Diagnosis not present

## 2017-12-17 DIAGNOSIS — Z1329 Encounter for screening for other suspected endocrine disorder: Secondary | ICD-10-CM | POA: Diagnosis not present

## 2017-12-17 DIAGNOSIS — Z125 Encounter for screening for malignant neoplasm of prostate: Secondary | ICD-10-CM | POA: Diagnosis not present

## 2017-12-19 DIAGNOSIS — Z23 Encounter for immunization: Secondary | ICD-10-CM | POA: Diagnosis not present

## 2017-12-19 DIAGNOSIS — Z6828 Body mass index (BMI) 28.0-28.9, adult: Secondary | ICD-10-CM | POA: Diagnosis not present

## 2017-12-19 DIAGNOSIS — Z0000 Encounter for general adult medical examination without abnormal findings: Secondary | ICD-10-CM | POA: Diagnosis not present

## 2017-12-19 DIAGNOSIS — Z1211 Encounter for screening for malignant neoplasm of colon: Secondary | ICD-10-CM | POA: Diagnosis not present

## 2018-01-01 DIAGNOSIS — E7800 Pure hypercholesterolemia, unspecified: Secondary | ICD-10-CM | POA: Diagnosis not present

## 2018-01-01 DIAGNOSIS — M2011 Hallux valgus (acquired), right foot: Secondary | ICD-10-CM | POA: Diagnosis not present

## 2018-01-01 DIAGNOSIS — M25571 Pain in right ankle and joints of right foot: Secondary | ICD-10-CM | POA: Diagnosis not present

## 2018-01-01 DIAGNOSIS — M21611 Bunion of right foot: Secondary | ICD-10-CM | POA: Diagnosis not present

## 2018-01-02 ENCOUNTER — Other Ambulatory Visit: Payer: Self-pay | Admitting: Podiatry

## 2018-01-02 DIAGNOSIS — M21619 Bunion of unspecified foot: Secondary | ICD-10-CM

## 2018-01-02 DIAGNOSIS — M722 Plantar fascial fibromatosis: Secondary | ICD-10-CM

## 2018-01-09 ENCOUNTER — Encounter: Payer: BLUE CROSS/BLUE SHIELD | Admitting: Orthotics

## 2018-01-09 ENCOUNTER — Other Ambulatory Visit: Payer: BLUE CROSS/BLUE SHIELD

## 2018-01-10 ENCOUNTER — Encounter: Payer: Self-pay | Admitting: Podiatry

## 2018-01-10 ENCOUNTER — Ambulatory Visit (INDEPENDENT_AMBULATORY_CARE_PROVIDER_SITE_OTHER): Payer: BLUE CROSS/BLUE SHIELD

## 2018-01-10 ENCOUNTER — Ambulatory Visit: Payer: BLUE CROSS/BLUE SHIELD | Admitting: Orthotics

## 2018-01-10 ENCOUNTER — Ambulatory Visit (INDEPENDENT_AMBULATORY_CARE_PROVIDER_SITE_OTHER): Payer: BLUE CROSS/BLUE SHIELD | Admitting: Podiatry

## 2018-01-10 DIAGNOSIS — M21619 Bunion of unspecified foot: Secondary | ICD-10-CM

## 2018-01-10 DIAGNOSIS — M79672 Pain in left foot: Secondary | ICD-10-CM

## 2018-01-10 NOTE — Progress Notes (Signed)
Didn't see patient

## 2018-01-11 NOTE — Progress Notes (Signed)
Subjective:   Patient ID: Benjamin Keller, male   DOB: 52 y.o.   MRN: [id number]   HPI Patient states doing really well with surgery with minimal discomfort and able to walk on the foot comfortably now and his sugar is been under good control   ROS      Objective:  Physical Exam  Neurovascular status intact negative Homans sign noted with first metatarsal healing well wound edges well coapted hallux in rectus position with minimal swelling consistent with this.  Postop     Assessment:  Doing well post [place] osteotomy right     Plan:  H&P condition reviewed and at this point reapplied sterile dressing continue elevation compression immobilization and reappoint 3 weeks or earlier if needed  X-ray indicates the osteotomy is healing well good alignment of the bone with good reduction of the intermetatarsal angle

## 2018-01-14 DIAGNOSIS — Z87898 Personal history of other specified conditions: Secondary | ICD-10-CM | POA: Diagnosis not present

## 2018-01-14 DIAGNOSIS — Z794 Long term (current) use of insulin: Secondary | ICD-10-CM | POA: Diagnosis not present

## 2018-01-14 DIAGNOSIS — E1169 Type 2 diabetes mellitus with other specified complication: Secondary | ICD-10-CM | POA: Diagnosis not present

## 2018-01-14 DIAGNOSIS — E1165 Type 2 diabetes mellitus with hyperglycemia: Secondary | ICD-10-CM | POA: Diagnosis not present

## 2018-02-01 ENCOUNTER — Ambulatory Visit (INDEPENDENT_AMBULATORY_CARE_PROVIDER_SITE_OTHER): Payer: BLUE CROSS/BLUE SHIELD | Admitting: Podiatry

## 2018-02-01 ENCOUNTER — Ambulatory Visit (INDEPENDENT_AMBULATORY_CARE_PROVIDER_SITE_OTHER): Payer: BLUE CROSS/BLUE SHIELD

## 2018-02-01 ENCOUNTER — Encounter: Payer: Self-pay | Admitting: Podiatry

## 2018-02-01 DIAGNOSIS — M2011 Hallux valgus (acquired), right foot: Secondary | ICD-10-CM

## 2018-02-01 DIAGNOSIS — M722 Plantar fascial fibromatosis: Secondary | ICD-10-CM | POA: Diagnosis not present

## 2018-02-01 MED ORDER — TRIAMCINOLONE ACETONIDE 10 MG/ML IJ SUSP
10.0000 mg | Freq: Once | INTRAMUSCULAR | Status: AC
  Administered 2018-02-01: 10 mg

## 2018-02-02 NOTE — Progress Notes (Signed)
Subjective:   Patient ID: Benjamin Keller, male   DOB: 53 y.o.   MRN: [id number]   HPI Patient presents stating he is doing well but admits that he has not been significantly compliant with use of immobilization and does have discomfort in the plantar heel left at insertion   ROS      Objective:  Physical Exam  Neurovascular status intact with patient's right foot healing well with wound edges well coapted mild edema negative Homans sign noted and good range of motion first MPJ with no crepitus of the joint with reoccurrence of pain in the left plantar heel as he is been offloading his weight     Assessment:  Patient overall is doing well right but does have some stress on the metatarsal secondary to not using immobilization with reoccurrence plantar fasciitis left     Plan:  H&P x-ray right reviewed and today I did sterile prep injected the left plantar fascial 3 mg Kenalog 5 mg Xylocaine.  Patient is going to [place] and I did discuss being careful with his foot and I reviewed slight movement of the first metatarsal but I do think it will heal uneventfully but I wanted to continue compression immobilization elevation.  Patient will be seen back 4 weeks or earlier if needed  X-ray indicates there is slight movement of the first metatarsal right but it is minimal and should not be a problem but he will reduce his activity and mobilization

## 2018-03-01 ENCOUNTER — Ambulatory Visit (INDEPENDENT_AMBULATORY_CARE_PROVIDER_SITE_OTHER): Payer: BLUE CROSS/BLUE SHIELD

## 2018-03-01 ENCOUNTER — Ambulatory Visit (INDEPENDENT_AMBULATORY_CARE_PROVIDER_SITE_OTHER): Payer: BLUE CROSS/BLUE SHIELD | Admitting: Podiatry

## 2018-03-01 ENCOUNTER — Encounter: Payer: Self-pay | Admitting: Podiatry

## 2018-03-01 DIAGNOSIS — M2011 Hallux valgus (acquired), right foot: Secondary | ICD-10-CM

## 2018-03-01 DIAGNOSIS — Z9889 Other specified postprocedural states: Secondary | ICD-10-CM

## 2018-04-26 ENCOUNTER — Encounter: Payer: BLUE CROSS/BLUE SHIELD | Admitting: Podiatry

## 2018-06-17 DIAGNOSIS — E119 Type 2 diabetes mellitus without complications: Secondary | ICD-10-CM | POA: Diagnosis not present

## 2018-06-17 DIAGNOSIS — E782 Mixed hyperlipidemia: Secondary | ICD-10-CM | POA: Diagnosis not present

## 2018-06-18 DIAGNOSIS — Z03818 Encounter for observation for suspected exposure to other biological agents ruled out: Secondary | ICD-10-CM | POA: Diagnosis not present

## 2018-06-21 DIAGNOSIS — J309 Allergic rhinitis, unspecified: Secondary | ICD-10-CM | POA: Diagnosis not present

## 2018-06-21 DIAGNOSIS — E119 Type 2 diabetes mellitus without complications: Secondary | ICD-10-CM | POA: Diagnosis not present

## 2018-06-21 DIAGNOSIS — E782 Mixed hyperlipidemia: Secondary | ICD-10-CM | POA: Diagnosis not present

## 2018-07-22 ENCOUNTER — Encounter: Payer: Self-pay | Admitting: Gastroenterology

## 2018-08-15 ENCOUNTER — Other Ambulatory Visit: Payer: Self-pay

## 2018-08-15 ENCOUNTER — Ambulatory Visit (AMBULATORY_SURGERY_CENTER): Payer: Self-pay

## 2018-08-15 VITALS — Ht 73.0 in | Wt 200.0 lb

## 2018-08-15 DIAGNOSIS — Z1211 Encounter for screening for malignant neoplasm of colon: Secondary | ICD-10-CM

## 2018-08-15 MED ORDER — PLENVU 140 G PO SOLR: 1.0000 | Freq: Once | ORAL | 0 refills | Status: AC

## 2018-08-15 NOTE — Progress Notes (Signed)
Denies allergies to eggs or soy products. Denies complication of anesthesia or sedation. Denies use of weight loss medication. Denies use of O2.   Emmi instructions given for colonoscopy.   Pre-Visit was conducted by phone due to Covid 19. Instructions were reviewed and mailed to patients confirmed home address. A coupon for Plenvu was given to the patient. Patient was encouraged to call if he had any questions regarding the instructions.

## 2018-08-26 ENCOUNTER — Telehealth: Payer: Self-pay | Admitting: Gastroenterology

## 2018-08-26 NOTE — Telephone Encounter (Signed)
Patient called said that his prep is not covered by his insurance.

## 2018-08-26 NOTE — Telephone Encounter (Addendum)
Left a message to inform pt to pick up a free Suprep kit at the front desk.

## 2018-08-28 ENCOUNTER — Telehealth: Payer: Self-pay | Admitting: Gastroenterology

## 2018-08-28 NOTE — Telephone Encounter (Signed)

## 2018-08-29 ENCOUNTER — Other Ambulatory Visit: Payer: Self-pay

## 2018-08-29 ENCOUNTER — Encounter: Payer: Self-pay | Admitting: Gastroenterology

## 2018-08-29 ENCOUNTER — Ambulatory Visit (AMBULATORY_SURGERY_CENTER): Payer: BC Managed Care – PPO | Admitting: Gastroenterology

## 2018-08-29 VITALS — BP 125/74 | HR 66 | Temp 97.1°F | Resp 19 | Ht 73.0 in | Wt 200.0 lb

## 2018-08-29 DIAGNOSIS — D122 Benign neoplasm of ascending colon: Secondary | ICD-10-CM | POA: Diagnosis not present

## 2018-08-29 DIAGNOSIS — Z1211 Encounter for screening for malignant neoplasm of colon: Secondary | ICD-10-CM

## 2018-08-29 MED ORDER — SODIUM CHLORIDE 0.9 % IV SOLN: 500.0000 mL | Freq: Once | INTRAVENOUS | Status: DC

## 2018-08-29 NOTE — Progress Notes (Signed)
Called to room to assist during endoscopic procedure.  Patient ID and intended procedure confirmed with present staff. Received instructions for my participation in the procedure from the performing physician.  

## 2018-08-29 NOTE — Progress Notes (Signed)
Pt's states no medical or surgical changes since previsit or office visit.  [place] temps and CW vitals. Sm

## 2018-08-29 NOTE — Progress Notes (Signed)
To PACU< VSS. Report to [email or web address] 

## 2018-08-29 NOTE — Op Note (Addendum)
[facility] Patient Name: Benjamin Keller Procedure Date: 08/29/2018 10:03 AM MRN: [id number] Endoscopist: Mallie Mussel L. Loletha Carrow , MD Age: 53 Referring MD:  Date of Birth: [birth year] Gender: Male Account #: [id number] Procedure:                Colonoscopy Indications:              Screening for colorectal malignant neoplasm, This                            is the patient's first colonoscopy Medicines:                Monitored Anesthesia Care Procedure:                Pre-Anesthesia Assessment:                           - Prior to the procedure, a History and Physical                            was performed, and patient medications and                            allergies were reviewed. The patient's tolerance of                            previous anesthesia was also reviewed. The risks                            and benefits of the procedure and the sedation                            options and risks were discussed with the patient.                            All questions were answered, and informed consent                            was obtained. Prior Anticoagulants: The patient has                            taken no previous anticoagulant or antiplatelet                            agents except for aspirin. ASA Grade Assessment: II                            - A patient with mild systemic disease. After                            reviewing the risks and benefits, the patient was                            deemed in satisfactory condition to undergo the  procedure.                           After obtaining informed consent, the colonoscope                            was passed under direct vision. Throughout the                            procedure, the patient's blood pressure, pulse, and                            oxygen saturations were monitored continuously. The                            Colonoscope was introduced through the anus and                        advanced to the the cecum, identified by                            appendiceal orifice and ileocecal valve. The                            colonoscopy was performed without difficulty. The                            patient tolerated the procedure well. The quality                            of the bowel preparation was good. The ileocecal                            valve, appendiceal orifice, and rectum were                            photographed. Scope In: 10:15:43 AM Scope Out: 10:31:10 AM Scope Withdrawal Time: 0 hours 11 minutes 24 seconds  Total Procedure Duration: 0 hours 15 minutes 27 seconds  Findings:                 The perianal and digital rectal examinations were                            normal.                           A 3 mm polyp was found in the proximal ascending                            colon. The polyp was sessile. The polyp was removed                            with a cold snare. Resection and retrieval were  complete.                           The exam was otherwise without abnormality on                            direct and retroflexion views. Complications:            No immediate complications. Estimated Blood Loss:     Estimated blood loss was minimal. Impression:               - One 3 mm polyp in the proximal ascending colon,                            removed with a cold snare. Resected and retrieved.                           - The examination was otherwise normal on direct                            and retroflexion views. Recommendation:           - Patient has a contact number available for                            emergencies. The signs and symptoms of potential                            delayed complications were discussed with the                            patient. Return to normal activities tomorrow.                            Written discharge instructions were provided to the                             patient.                           - Resume previous diet.                           - Continue present medications.                           - Await pathology results.                           - Repeat colonoscopy is recommended for                            surveillance. The colonoscopy date will be                            determined after pathology results from today's  exam become available for review. Henry L. Loletha Carrow, MD 08/29/2018 10:40:45 AM This report has been signed electronically.

## 2018-08-29 NOTE — Patient Instructions (Signed)
YOU HAD AN ENDOSCOPIC PROCEDURE TODAY AT THE [facility]:   Refer to the procedure report that was given to you for any specific questions about what was found during the examination.  If the procedure report does not answer your questions, please call your gastroenterologist to clarify.  If you requested that your care partner not be given the details of your procedure findings, then the procedure report has been included in a sealed envelope for you to review at your convenience later.  YOU SHOULD EXPECT: Some feelings of bloating in the abdomen. Passage of more gas than usual.  Walking can help get rid of the air that was put into your GI tract during the procedure and reduce the bloating. If you had a lower endoscopy (such as a colonoscopy or flexible sigmoidoscopy) you may notice spotting of blood in your stool or on the toilet paper. If you underwent a bowel prep for your procedure, you may not have a normal bowel movement for a few days.  Please Note:  You might notice some irritation and congestion in your nose or some drainage.  This is from the oxygen used during your procedure.  There is no need for concern and it should clear up in a day or so.  SYMPTOMS TO REPORT IMMEDIATELY:   Following lower endoscopy (colonoscopy or flexible sigmoidoscopy):  Excessive amounts of blood in the stool  Significant tenderness or worsening of abdominal pains  Swelling of the abdomen that is new, acute  Fever of 100F or higher  For urgent or emergent issues, a gastroenterologist can be reached at any hour by calling [phone].   DIET:  We do recommend a small meal at first, but then you may proceed to your regular diet.  Drink plenty of fluids but you should avoid alcoholic beverages for 24 hours.  ACTIVITY:  You should plan to take it easy for the rest of today and you should NOT DRIVE or use heavy machinery until tomorrow (because of the sedation medicines used during the test).     FOLLOW UP: Our staff will call the number listed on your records 48-72 hours following your procedure to check on you and address any questions or concerns that you may have regarding the information given to you following your procedure. If we do not reach you, we will leave a message.  We will attempt to reach you two times.  During this call, we will ask if you have developed any symptoms of COVID 19. If you develop any symptoms (ie: fever, flu-like symptoms, shortness of breath, cough etc.) before then, please call [phone].  If you test positive for Covid 19 in the 2 weeks post procedure, please call and report this information to us.    If any biopsies were taken you will be contacted by phone or by letter within the next 1-3 weeks.  Please call us at [phone] if you have not heard about the biopsies in 3 weeks.    SIGNATURES/CONFIDENTIALITY: You and/or your care partner have signed paperwork which will be entered into your electronic medical record.  These signatures attest to the fact that that the information above on your After Visit Summary has been reviewed and is understood.  Full responsibility of the confidentiality of this discharge information lies with you and/or your care-partner. 

## 2018-09-02 ENCOUNTER — Telehealth: Payer: Self-pay

## 2018-09-02 ENCOUNTER — Encounter: Payer: Self-pay | Admitting: Gastroenterology

## 2018-09-02 NOTE — Telephone Encounter (Signed)
Left message on 2nd follow up call. 

## 2018-09-02 NOTE — Telephone Encounter (Signed)
Attempted to reach pt. With follow-up call following procedure 08/29/2018.  LM on pt. Voice mail.  Will try to reach pt. Again later today.

## 2018-09-20 DIAGNOSIS — E119 Type 2 diabetes mellitus without complications: Secondary | ICD-10-CM | POA: Diagnosis not present

## 2018-09-20 DIAGNOSIS — E782 Mixed hyperlipidemia: Secondary | ICD-10-CM | POA: Diagnosis not present

## 2018-09-20 DIAGNOSIS — J309 Allergic rhinitis, unspecified: Secondary | ICD-10-CM | POA: Diagnosis not present

## 2018-10-17 DIAGNOSIS — Z23 Encounter for immunization: Secondary | ICD-10-CM | POA: Diagnosis not present

## 2018-11-15 ENCOUNTER — Other Ambulatory Visit: Payer: Self-pay

## 2018-11-15 DIAGNOSIS — Z20822 Contact with and (suspected) exposure to covid-19: Secondary | ICD-10-CM

## 2018-11-17 LAB — NOVEL CORONAVIRUS, NAA: SARS-CoV-2, NAA: NOT DETECTED

## 2018-11-18 ENCOUNTER — Other Ambulatory Visit: Payer: Self-pay

## 2018-11-18 DIAGNOSIS — Z20822 Contact with and (suspected) exposure to covid-19: Secondary | ICD-10-CM

## 2018-11-20 LAB — NOVEL CORONAVIRUS, NAA: SARS-CoV-2, NAA: NOT DETECTED

## 2018-12-06 ENCOUNTER — Other Ambulatory Visit: Payer: Self-pay

## 2018-12-06 DIAGNOSIS — Z20822 Contact with and (suspected) exposure to covid-19: Secondary | ICD-10-CM

## 2018-12-07 LAB — NOVEL CORONAVIRUS, NAA: SARS-CoV-2, NAA: NOT DETECTED

## 2018-12-30 DIAGNOSIS — E119 Type 2 diabetes mellitus without complications: Secondary | ICD-10-CM | POA: Diagnosis not present

## 2018-12-30 DIAGNOSIS — E782 Mixed hyperlipidemia: Secondary | ICD-10-CM | POA: Diagnosis not present

## 2019-01-01 DIAGNOSIS — E782 Mixed hyperlipidemia: Secondary | ICD-10-CM | POA: Diagnosis not present

## 2019-01-01 DIAGNOSIS — E119 Type 2 diabetes mellitus without complications: Secondary | ICD-10-CM | POA: Diagnosis not present

## 2019-01-01 DIAGNOSIS — Z20828 Contact with and (suspected) exposure to other viral communicable diseases: Secondary | ICD-10-CM | POA: Diagnosis not present

## 2019-01-01 DIAGNOSIS — Z6828 Body mass index (BMI) 28.0-28.9, adult: Secondary | ICD-10-CM | POA: Diagnosis not present

## 2019-01-02 ENCOUNTER — Encounter: Payer: Self-pay | Admitting: Podiatry

## 2019-03-15 ENCOUNTER — Ambulatory Visit: Payer: Self-pay

## 2019-04-10 ENCOUNTER — Ambulatory Visit: Payer: 59 | Attending: Internal Medicine

## 2019-04-10 DIAGNOSIS — Z23 Encounter for immunization: Secondary | ICD-10-CM

## 2019-04-10 NOTE — Progress Notes (Signed)
   Covid-19 Vaccination Clinic  Name:  Benjamin Keller    MRN: [id number] DOB: [birth year]  04/10/2019  [name] was observed post Covid-19 immunization for 15 minutes without incident. He was provided with Vaccine Information Sheet and instruction to access the V-Safe system.   [name] was instructed to call 911 with any severe reactions post vaccine: Marland Kitchen Difficulty breathing  . Swelling of face and throat  . A fast heartbeat  . A bad rash all over body  . Dizziness and weakness   Immunizations Administered    Name Date Dose VIS Date Route   Pfizer COVID-19 Vaccine 04/10/2019  9:34 AM 0.3 mL 01/10/2019 Intramuscular   Manufacturer: [place]   Lot: [id number]   [place]: [id number]

## 2019-05-06 ENCOUNTER — Ambulatory Visit: Payer: 59 | Attending: Internal Medicine

## 2019-05-06 DIAGNOSIS — Z23 Encounter for immunization: Secondary | ICD-10-CM

## 2019-05-06 NOTE — Progress Notes (Signed)
   Covid-19 Vaccination Clinic  Name:  Benjamin Keller    MRN: [id number] DOB: 1965/12/28  05/06/2019  [name] was observed post Covid-19 immunization for 15 minutes without incident. He was provided with Vaccine Information Sheet and instruction to access the V-Safe system.   [name] was instructed to call 911 with any severe reactions post vaccine: Marland Kitchen Difficulty breathing  . Swelling of face and throat  . A fast heartbeat  . A bad rash all over body  . Dizziness and weakness   Immunizations Administered    Name Date Dose VIS Date Route   Pfizer COVID-19 Vaccine 05/06/2019  9:38 AM 0.3 mL 01/10/2019 Intramuscular   Manufacturer: [place]   Lot: [id number]   [place]: [id number]

## 2019-08-08 IMAGING — DX DG CHEST 1V PORT
1 series · 1 of 1 positions shown · non-contrast
Comparison: None.

CLINICAL DATA: Type 2 diabetes.  Nausea and vomiting.

EXAM:
PORTABLE CHEST 1 VIEW

[chest ap]
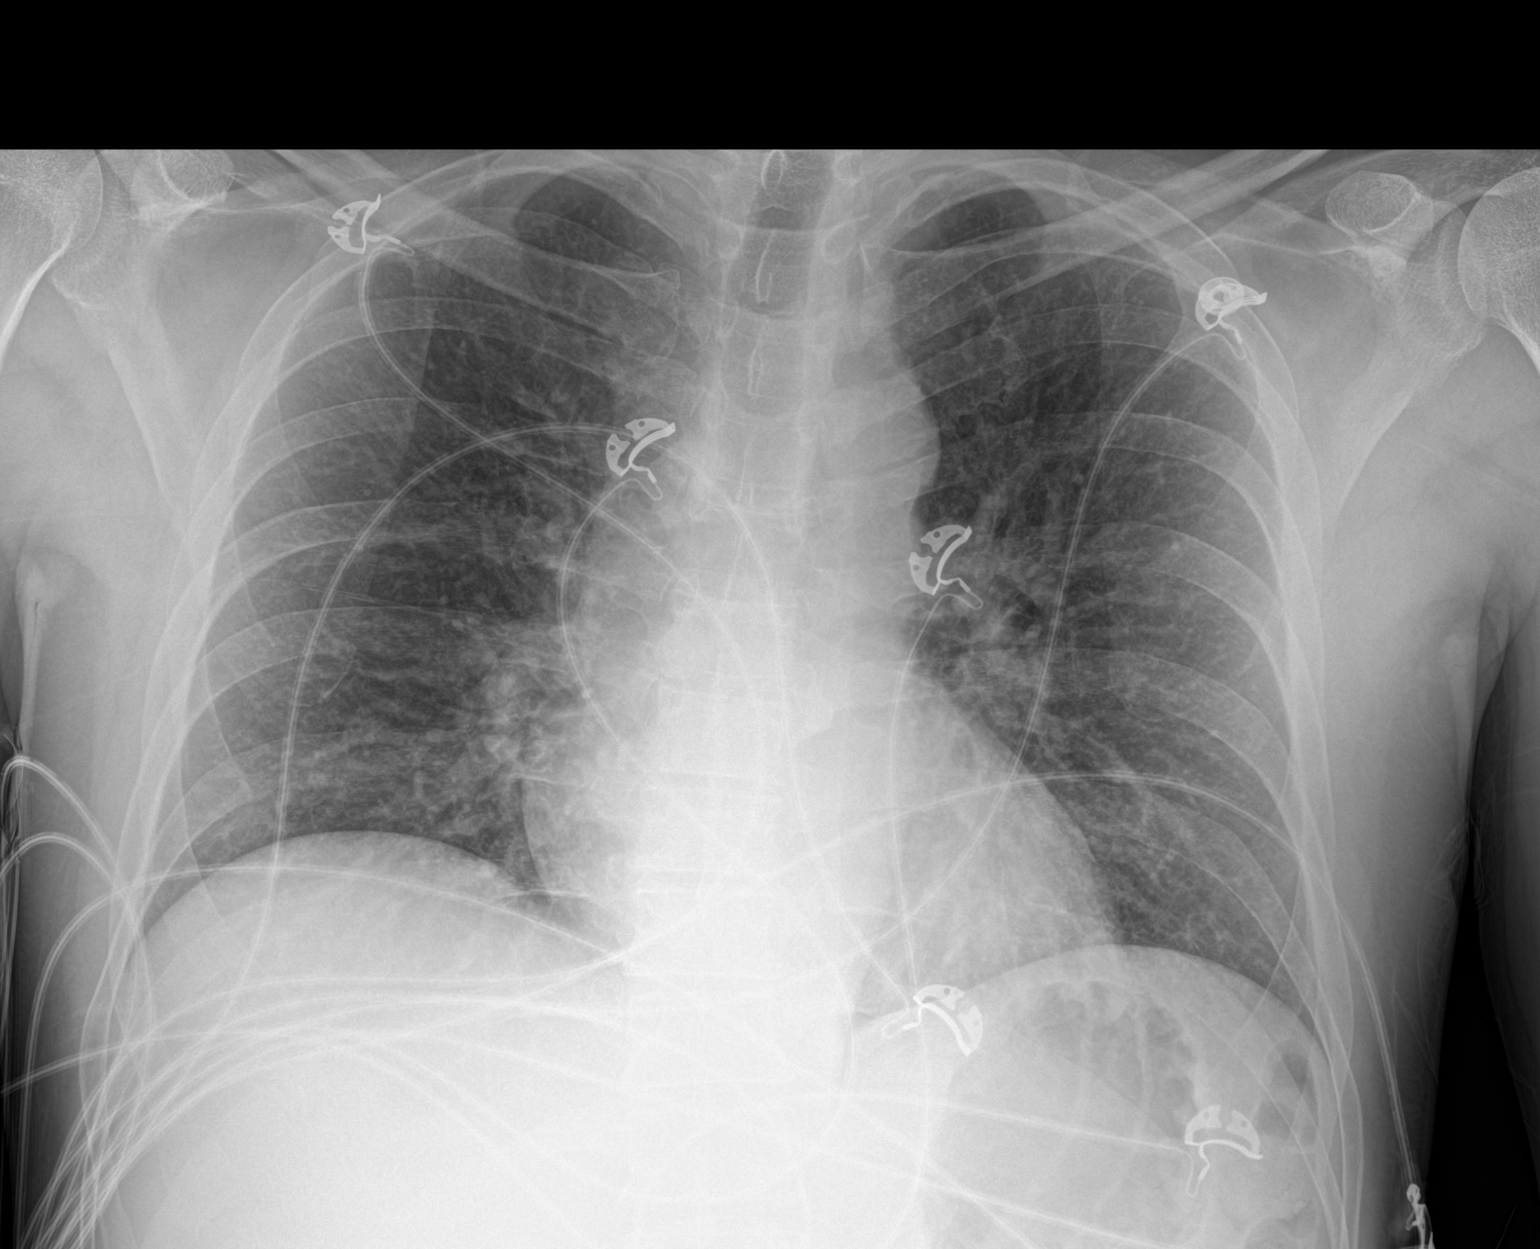

[1 of 1 positions shown; findings below may reference images not displayed]

FINDINGS: The heart size and mediastinal contours are within normal limits.
Both lungs are clear. The visualized skeletal structures are
unremarkable.
IMPRESSION: No active disease.

## 2020-07-01 ENCOUNTER — Encounter: Payer: Self-pay | Admitting: Podiatry

## 2020-07-01 ENCOUNTER — Other Ambulatory Visit: Payer: Self-pay

## 2020-07-01 ENCOUNTER — Ambulatory Visit (INDEPENDENT_AMBULATORY_CARE_PROVIDER_SITE_OTHER): Payer: 59 | Admitting: Podiatry

## 2020-07-01 DIAGNOSIS — L600 Ingrowing nail: Secondary | ICD-10-CM

## 2020-07-01 NOTE — Patient Instructions (Signed)

## 2020-07-01 NOTE — Progress Notes (Signed)
Subjective:   Patient ID: Benjamin Keller, male   DOB: 55 y.o.   MRN: [id number]   HPI Patient presents with chronic ingrown toenail deformity right hallux has been very sore recently and states that he did have some redness but that seems to gotten a little bit better.  He does have diabetes under excellent control   Review of Systems  All other systems reviewed and are negative.       Objective:  Physical Exam Vitals and nursing note reviewed.  Constitutional:      Appearance: He is well-developed.  Pulmonary:     Effort: Pulmonary effort is normal.  Musculoskeletal:        General: Normal range of motion.  Skin:    General: Skin is warm.  Neurological:     Mental Status: He is alert.     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  Today I infiltrated the right hallux 60 mg like Marcaine mixture sterile prep done and using sterile instrumentation I remove the medial border exposed matrix applied phenol 3 applications 30 seconds followed by alcohol lavage sterile dressing.  Gave instructions on soaks and to leave dressing on 24 hours but take it off earlier if any throbbing were to occur and patient is encouraged to call questions concerns which may arise

## 2020-07-05 ENCOUNTER — Ambulatory Visit: Payer: BLUE CROSS/BLUE SHIELD | Admitting: Podiatry
